# Patient Record
Sex: Male | Born: 1975 | Race: Black or African American | Hispanic: No | Marital: Single | Smoking: Current some day smoker
Health system: Southern US, Community
[De-identification: ages and names within clinical notes are randomized; demographics above are authoritative.]

## PROBLEM LIST (undated history)

## (undated) DIAGNOSIS — F209 Schizophrenia, unspecified: Secondary | ICD-10-CM

## (undated) DIAGNOSIS — F1010 Alcohol abuse, uncomplicated: Secondary | ICD-10-CM

## (undated) DIAGNOSIS — F319 Bipolar disorder, unspecified: Secondary | ICD-10-CM

## (undated) DIAGNOSIS — R569 Unspecified convulsions: Secondary | ICD-10-CM

## (undated) HISTORY — PX: SHOULDER SURGERY: SHX246

## (undated) HISTORY — PX: NECK SURGERY: SHX720

---

## 1999-10-26 ENCOUNTER — Emergency Department (HOSPITAL_COMMUNITY): Admission: EM | Admit: 1999-10-26 | Discharge: 1999-10-26 | Payer: Self-pay

## 2001-03-27 ENCOUNTER — Emergency Department (HOSPITAL_COMMUNITY): Admission: EM | Admit: 2001-03-27 | Discharge: 2001-03-28 | Payer: Self-pay | Admitting: Emergency Medicine

## 2009-07-30 ENCOUNTER — Emergency Department (HOSPITAL_COMMUNITY): Admission: EM | Admit: 2009-07-30 | Discharge: 2009-07-30 | Payer: Self-pay | Admitting: Emergency Medicine

## 2010-07-15 LAB — URINALYSIS, ROUTINE W REFLEX MICROSCOPIC
Hgb urine dipstick: NEGATIVE
Nitrite: NEGATIVE
Specific Gravity, Urine: 1.025 (ref 1.005–1.030)
Urobilinogen, UA: 1 mg/dL (ref 0.0–1.0)
pH: 6.5 (ref 5.0–8.0)

## 2010-07-15 LAB — COMPREHENSIVE METABOLIC PANEL
ALT: 29 U/L (ref 0–53)
AST: 20 U/L (ref 0–37)
Alkaline Phosphatase: 18 U/L — ABNORMAL LOW (ref 39–117)
CO2: 29 mEq/L (ref 19–32)
Chloride: 107 mEq/L (ref 96–112)
GFR calc Af Amer: >60 mL/min (ref >60)
GFR calc non Af Amer: >60 mL/min (ref >60)
Sodium: 141 mEq/L (ref 135–145)
Total Bilirubin: 0.6 mg/dL (ref 0.3–1.2)

## 2010-07-15 LAB — CBC
RBC: 4.51 MIL/uL (ref 4.22–5.81)
WBC: 4.4 10*3/uL (ref 4.0–10.5)

## 2014-09-22 ENCOUNTER — Encounter (HOSPITAL_COMMUNITY): Payer: Self-pay | Admitting: *Deleted

## 2014-09-22 ENCOUNTER — Emergency Department (HOSPITAL_COMMUNITY): Payer: Self-pay

## 2014-09-22 ENCOUNTER — Emergency Department (HOSPITAL_COMMUNITY)
Admission: EM | Admit: 2014-09-22 | Discharge: 2014-09-22 | Disposition: A | Discharge status: Home / Self Care | Payer: Self-pay | Attending: Emergency Medicine | Admitting: Emergency Medicine

## 2014-09-22 DIAGNOSIS — Z79899 Other long term (current) drug therapy: Secondary | ICD-10-CM | POA: Insufficient documentation

## 2014-09-22 DIAGNOSIS — S21111A Laceration without foreign body of right front wall of thorax without penetration into thoracic cavity, initial encounter: Secondary | ICD-10-CM | POA: Insufficient documentation

## 2014-09-22 DIAGNOSIS — Y998 Other external cause status: Secondary | ICD-10-CM | POA: Insufficient documentation

## 2014-09-22 DIAGNOSIS — Y9289 Other specified places as the place of occurrence of the external cause: Secondary | ICD-10-CM | POA: Insufficient documentation

## 2014-09-22 DIAGNOSIS — S199XXA Unspecified injury of neck, initial encounter: Secondary | ICD-10-CM | POA: Insufficient documentation

## 2014-09-22 DIAGNOSIS — F419 Anxiety disorder, unspecified: Secondary | ICD-10-CM | POA: Insufficient documentation

## 2014-09-22 DIAGNOSIS — Z23 Encounter for immunization: Secondary | ICD-10-CM | POA: Insufficient documentation

## 2014-09-22 DIAGNOSIS — F319 Bipolar disorder, unspecified: Secondary | ICD-10-CM | POA: Insufficient documentation

## 2014-09-22 DIAGNOSIS — Y9389 Activity, other specified: Secondary | ICD-10-CM | POA: Insufficient documentation

## 2014-09-22 DIAGNOSIS — IMO0002 Reserved for concepts with insufficient information to code with codable children: Secondary | ICD-10-CM

## 2014-09-22 HISTORY — DX: Schizophrenia, unspecified: F20.9

## 2014-09-22 HISTORY — DX: Bipolar disorder, unspecified: F31.9

## 2014-09-22 MED ORDER — LIDOCAINE-EPINEPHRINE (PF) 2 %-1:200000 IJ SOLN
20.0000 mL | Freq: Once | INTRAMUSCULAR | Status: AC
  Administered 2014-09-22: 20 mL
  Filled 2014-09-22: qty 20

## 2014-09-22 MED ORDER — HYDROCODONE-ACETAMINOPHEN 5-325 MG PO TABS: 1.0000 | ORAL_TABLET | Freq: Four times a day (QID) | ORAL | Status: DC | PRN

## 2014-09-22 MED ORDER — MORPHINE SULFATE 4 MG/ML IJ SOLN
4.0000 mg | Freq: Once | INTRAMUSCULAR | Status: AC
  Administered 2014-09-22: 4 mg via INTRAVENOUS
  Filled 2014-09-22: qty 1

## 2014-09-22 MED ORDER — TETANUS-DIPHTH-ACELL PERTUSSIS 5-2.5-18.5 LF-MCG/0.5 IM SUSP
0.5000 mL | Freq: Once | INTRAMUSCULAR | Status: AC
  Administered 2014-09-22: 0.5 mL via INTRAMUSCULAR
  Filled 2014-09-22: qty 0.5
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
## 2014-09-22 NOTE — ED Notes (Signed)
EKG called to cancel exported EKG.

## 2014-09-22 NOTE — Discharge Instructions (Signed)
Laceration Care, Adult °A laceration is a cut or lesion that goes through all layers of the skin and into the tissue just beneath the skin. °TREATMENT  °Some lacerations may not require closure. Some lacerations may not be able to be closed due to an increased risk of infection. It is important to see your caregiver as soon as possible after an injury to minimize the risk of infection and maximize the opportunity for successful closure. °If closure is appropriate, pain medicines may be given, if needed. The wound will be cleaned to help prevent infection. Your caregiver will use stitches (sutures), staples, wound glue (adhesive), or skin adhesive strips to repair the laceration. These tools bring the skin edges together to allow for faster healing and a better cosmetic outcome. However, all wounds will heal with a scar. Once the wound has healed, scarring can be minimized by covering the wound with sunscreen during the day for 1 full year. °HOME CARE INSTRUCTIONS  °For sutures or staples: °· Keep the wound clean and dry. °· If you were given a bandage (dressing), you should change it at least once a day. Also, change the dressing if it becomes wet or dirty, or as directed by your caregiver. °· Wash the wound with soap and water 2 times a day. Rinse the wound off with water to remove all soap. Pat the wound dry with a clean towel. °· After cleaning, apply a thin layer of the antibiotic ointment as recommended by your caregiver. This will help prevent infection and keep the dressing from sticking. °· You may shower as usual after the first 24 hours. Do not soak the wound in water until the sutures are removed. °· Only take over-the-counter or prescription medicines for pain, discomfort, or fever as directed by your caregiver. °· Get your sutures or staples removed as directed by your caregiver. °For skin adhesive strips: °· Keep the wound clean and dry. °· Do not get the skin adhesive strips wet. You may bathe  carefully, using caution to keep the wound dry. °· If the wound gets wet, pat it dry with a clean towel. °· Skin adhesive strips will fall off on their own. You may trim the strips as the wound heals. Do not remove skin adhesive strips that are still stuck to the wound. They will fall off in time. °For wound adhesive: °· You may briefly wet your wound in the shower or bath. Do not soak or scrub the wound. Do not swim. Avoid periods of heavy perspiration until the skin adhesive has fallen off on its own. After showering or bathing, gently pat the wound dry with a clean towel. °· Do not apply liquid medicine, cream medicine, or ointment medicine to your wound while the skin adhesive is in place. This may loosen the film before your wound is healed. °· If a dressing is placed over the wound, be careful not to apply tape directly over the skin adhesive. This may cause the adhesive to be pulled off before the wound is healed. °· Avoid prolonged exposure to sunlight or tanning lamps while the skin adhesive is in place. Exposure to ultraviolet light in the first year will darken the scar. °· The skin adhesive will usually remain in place for 5 to 10 days, then naturally fall off the skin. Do not pick at the adhesive film. °You may need a tetanus shot if: °· You cannot remember when you had your last tetanus shot. °· You have never had a tetanus   shot. If you get a tetanus shot, your arm may swell, get red, and feel warm to the touch. This is common and not a problem. If you need a tetanus shot and you choose not to have one, there is a rare chance of getting tetanus. Sickness from tetanus can be serious. SEEK MEDICAL CARE IF:   You have redness, swelling, or increasing pain in the wound.  You see a red line that goes away from the wound.  You have yellowish-white fluid (pus) coming from the wound.  You have a fever.  You notice a bad smell coming from the wound or dressing.  Your wound breaks open before or  after sutures have been removed.  You notice something coming out of the wound such as wood or glass.  Your wound is on your hand or foot and you cannot move a finger or toe. SEEK IMMEDIATE MEDICAL CARE IF:   Your pain is not controlled with prescribed medicine.  You have severe swelling around the wound causing pain and numbness or a change in color in your arm, hand, leg, or foot.  Your wound splits open and starts bleeding.  You have worsening numbness, weakness, or loss of function of any joint around or beyond the wound.  You develop painful lumps near the wound or on the skin anywhere on your body. MAKE SURE YOU:   Understand these instructions.  Will watch your condition.  Will get help right away if you are not doing well or get worse. Document Released: 04/12/2005 Document Revised: 07/05/2011 Document Reviewed: 10/06/2010 [facility] [employer] Patient Information 2015 [place], [place]. This information is not intended to replace advice given to you by your health care provider. Make sure you discuss any questions you have with your health care provider.    Please go to your Primary Care Physician, an Urgent Care or return to the Emergency Department to have your staples or sutures removed 14 days from today.

## 2014-09-22 NOTE — ED Notes (Signed)
Resident at bedside performing sutures

## 2014-09-26 ENCOUNTER — Encounter (HOSPITAL_COMMUNITY): Payer: Self-pay | Admitting: Emergency Medicine

## 2014-09-26 ENCOUNTER — Emergency Department (HOSPITAL_COMMUNITY): Payer: Self-pay

## 2014-09-26 ENCOUNTER — Emergency Department (HOSPITAL_COMMUNITY)
Admission: EM | Admit: 2014-09-26 | Discharge: 2014-09-26 | Disposition: A | Discharge status: Home / Self Care | Payer: Self-pay | Attending: Emergency Medicine | Admitting: Emergency Medicine

## 2014-09-26 DIAGNOSIS — S62637A Displaced fracture of distal phalanx of left little finger, initial encounter for closed fracture: Secondary | ICD-10-CM | POA: Insufficient documentation

## 2014-09-26 DIAGNOSIS — Y998 Other external cause status: Secondary | ICD-10-CM | POA: Insufficient documentation

## 2014-09-26 DIAGNOSIS — Z8659 Personal history of other mental and behavioral disorders: Secondary | ICD-10-CM | POA: Insufficient documentation

## 2014-09-26 DIAGNOSIS — S4991XA Unspecified injury of right shoulder and upper arm, initial encounter: Secondary | ICD-10-CM | POA: Insufficient documentation

## 2014-09-26 DIAGNOSIS — Z79899 Other long term (current) drug therapy: Secondary | ICD-10-CM | POA: Insufficient documentation

## 2014-09-26 DIAGNOSIS — Y9389 Activity, other specified: Secondary | ICD-10-CM | POA: Insufficient documentation

## 2014-09-26 DIAGNOSIS — Y92410 Unspecified street and highway as the place of occurrence of the external cause: Secondary | ICD-10-CM | POA: Insufficient documentation

## 2014-09-26 DIAGNOSIS — Z720 Tobacco use: Secondary | ICD-10-CM | POA: Insufficient documentation

## 2014-09-26 DIAGNOSIS — S62609A Fracture of unspecified phalanx of unspecified finger, initial encounter for closed fracture: Secondary | ICD-10-CM

## 2014-09-26 DIAGNOSIS — S4990XA Unspecified injury of shoulder and upper arm, unspecified arm, initial encounter: Secondary | ICD-10-CM

## 2014-09-26 MED ORDER — HYDROCODONE-ACETAMINOPHEN 5-325 MG PO TABS: 1.0000 | ORAL_TABLET | Freq: Four times a day (QID) | ORAL | Status: DC | PRN

## 2014-09-26 MED ORDER — LORAZEPAM 1 MG PO TABS
1.0000 mg | ORAL_TABLET | Freq: Once | ORAL | Status: AC
  Administered 2014-09-26: 1 mg via ORAL
  Filled 2014-09-26: qty 1

## 2014-09-26 MED ORDER — HYDROCODONE-ACETAMINOPHEN 5-325 MG PO TABS
2.0000 | ORAL_TABLET | Freq: Once | ORAL | Status: AC
  Administered 2014-09-26: 2 via ORAL
  Filled 2014-09-26: qty 2
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
## 2014-09-26 NOTE — ED Notes (Signed)
Pt placed into gown and on monitor upon arrival to room. Pt monitored by blood pressure, pulse ox, and 5 lead. pts family at bedside.  
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
## 2014-09-26 NOTE — ED Notes (Signed)
Pt remains monitored by blood pressure, pulse ox, and 5 leade. pts family remains at bedside.

## 2014-09-26 NOTE — Discharge Instructions (Signed)
Please keep your finger splinted at all times. You may remove the splint to bathe.  Finger Fracture Fractures of fingers are breaks in the bones of the fingers. There are many types of fractures. There are different ways of treating these fractures. Your health care provider will discuss the best way to treat your fracture. CAUSES Traumatic injury is the main cause of broken fingers. These include:  Injuries while playing sports.  Workplace injuries.  Falls. RISK FACTORS Activities that can increase your risk of finger fractures include:  Sports.  Workplace activities that involve machinery.  A condition called osteoporosis, which can make your bones less dense and cause them to fracture more easily. SIGNS AND SYMPTOMS The main symptoms of a broken finger are pain and swelling within 15 minutes after the injury. Other symptoms include:  Bruising of your finger.  Stiffness of your finger.  Numbness of your finger.  Exposed bones (compound fracture) if the fracture is severe. DIAGNOSIS  The best way to diagnose a broken bone is with X-ray imaging. Additionally, your health care provider will use this X-ray image to evaluate the position of the broken finger bones.  TREATMENT  Finger fractures can be treated with:   Nonreduction--This means the bones are in place. The finger is splinted without changing the positions of the bone pieces. The splint is usually left on for about a week to 10 days. This will depend on your fracture and what your health care provider thinks.  Closed reduction--The bones are put back into position without using surgery. The finger is then splinted.  Open reduction and internal fixation--The fracture site is opened. Then the bone pieces are fixed into place with pins or some type of hardware. This is seldom required. It depends on the severity of the fracture. HOME CARE INSTRUCTIONS   Follow your health care provider's instructions regarding  activities, exercises, and physical therapy.  Only take over-the-counter or prescription medicines for pain, discomfort, or fever as directed by your health care provider. SEEK MEDICAL CARE IF: You have pain or swelling that limits the motion or use of your fingers. SEEK IMMEDIATE MEDICAL CARE IF:  Your finger becomes numb. MAKE SURE YOU:   Understand these instructions.  Will watch your condition.  Will get help right away if you are not doing well or get worse. Document Released: 07/25/2000 Document Revised: 01/31/2013 Document Reviewed: 11/22/2012 [facility] Patient Information 2015 [place], [place]. This information is not intended to replace advice given to you by your health care provider. Make sure you discuss any questions you have with your health care provider.   Emergency Department Resource Guide 1) Find a Doctor and Pay Out of Pocket Although you won't have to find out who is covered by your insurance plan, it is a good idea to ask around and get recommendations. You will then need to call the office and see if the doctor you have chosen will accept you as a new patient and what types of options they offer for patients who are self-pay. Some doctors offer discounts or will set up payment plans for their patients who do not have insurance, but you will need to ask so you aren't surprised when you get to your appointment.  2) Contact Your Local Health Department Not all health departments have doctors that can see patients for sick visits, but many do, so it is worth a call to see if yours does. If you don't know where your local health department is, you can  check in your phone book. The CDC also has a tool to help you locate your state's health department, and many state websites also have listings of all of their local health departments.  3) Find a Walk-in Clinic If your illness is not likely to be very severe or complicated, you may want to try a walk in clinic. These are  popping up all over the country in pharmacies, drugstores, and shopping centers. They're usually staffed by nurse practitioners or physician assistants that have been trained to treat common illnesses and complaints. They're usually fairly quick and inexpensive. However, if you have serious medical issues or chronic medical problems, these are probably not your best option.  No Primary Care Doctor: - Call Health Connect at  [phone] - they can help you locate a primary care doctor that  accepts your insurance, provides certain services, etc. - Physician Referral Service- [phone]  Chronic Pain Problems: Organization         Address  Phone   Notes  [facility]  [phone] Patients need to be referred by their primary care doctor.   Medication Assistance: Organization         Address  Phone   Notes  [facility] [address] [place], [place] [address] [phone] --Must be a resident of [facility] -- Must have NO insurance coverage whatsoever (no Medicaid/ Medicare, etc.) -- The pt. MUST have a primary care doctor that directs their care regularly and follows them in the community   [facility]  [phone]   [employer]  [phone]    Agencies that provide inexpensive medical care: Organization         Address  Phone   Notes  [facility]  [phone]   [facility]    [phone]   [facility] [address] [place], [place] [address] [phone]   [facility] [address] [place], [place] [phone]   Planned Parenthood    [phone]   [facility]    [phone]   [facility]  [address], [place] Phone:  [phone], Fax:  [phone] Hours of Operation:  9 am - 6 pm, M-F.  Also accepts Medicaid/Medicare and self-pay.  [facility]  [address], [place] Phone: [phone], Fax: [phone]. Hours of Operation:  8:30 am - 5:30 pm, M-F.  Also accepts Medicaid and self-pay.  [facility] [place] [address] [place] Phone: [phone]   [facility] [address] Natasha Bence [place], [place] [phone], Ext. [phone] Mondays & Thursdays: 7-9 AM.  First 15 patients are seen on a first come, first serve basis.    Medicaid-accepting [facility] Providers:  Organization         Address  Phone   Notes  [facility] [address], [place] [phone] Also accepts self-pay patients.  [facility] [address] Laurell Josephs [place], [place]  [phone]   [facility] [address], [place] [phone]   [facility] [address] [place] [phone]   Renaye Rakers [address], [place]   [phone] Only accepts [place] patients after they have their name applied to  their card.   Self-Pay (no insurance) in [facility]:  Organization         Address  Phone   Notes  Sickle Cell Patients, [facility] [address] [place], [place] [phone]   [facility] [address] [place], [place] [phone]   [facility] [place]  [address], [place] [phone]   [facility]/Dr. Osei-Bonsu  [address] [place] or [address], [place] [phone] Phone number for both [place] and [place] locations is the same.  [facility] [address] [place] [phone]   [facility] [address] [place] or [address] [phone]   [facility] [address], [place] [phone], phone; [phone], fax Sees patients 1st and 3rd Saturday  of every month.  Must not qualify for public or private insurance (i.e. Medicaid, Medicare, [place] Health Choice, Veterans' Benefits)  Household income should be no more than 200% of the poverty level The clinic cannot treat you if you are pregnant or think you are pregnant  Sexually transmitted diseases are not treated at the clinic.    Dental Care: Organization         Address  Phone  Notes  [facility] [address] [place], [place] [phone] Accepts children up to age 40 who are enrolled in [place] or [place] Health Choice; pregnant women with a Medicaid card; and children who have applied for Medicaid or [place] Health Choice, but were declined, whose parents can pay a reduced fee at time of service.  [facility] of [facility]  [address], [place] [phone] Accepts children up to age 58 who are enrolled in [place] or [place] Health Choice; pregnant women with a Medicaid card; and children who have applied for Medicaid or [place] Health Choice, but were declined, whose parents can pay a reduced fee at time of service.  [facility]  [address] [place], [place] [phone] Patients are seen by appointment only. Walk-ins are not accepted. [facility] will see patients 37 years of age and older. Monday - Tuesday (8am-5pm) Most Wednesdays (8:30-5pm) $30 per visit, cash only  [facility]  [address], [facility] [phone] Patients are seen by appointment only. Walk-ins are not accepted. [facility] will see patients 54 years of age and older. One Wednesday Evening (Monthly: Volunteer Based).  $30 per visit, cash only  [employer]  [phone] for adults; Children under age 5, call Graduate Pediatric Dentistry at [phone]. Children aged 86-14, please call [phone] to request a pediatric application.   Dental services are provided in all areas of dental care including fillings, crowns and bridges, complete and partial dentures, implants, gum treatment, root canals, and extractions. Preventive care is also provided. Treatment is provided to both adults and children. Patients are selected via a lottery and there is often a waiting list.   [facility] [address] [place]  [phone] [email or web address]   [facility] [address] [place], [place] [phone], Ext. [phone] Second and Fourth Thursday of each month, opens at 6:30 AM; Clinic ends at 9 AM.  Patients are seen on a first-come first-served basis,  and a limited number are seen during each clinic.   [facility]  [address], [place], [place] [phone]   Eligibility Requirements You must have lived in [place], [place], or [place] counties for at least the last three months.   You cannot be eligible for state or federal sponsored [place]healthcare insurance, including CIG[employer]eterans Administration, [place]icaid, or [employer]icare.   You generally cannot be eligible for healthcare insurance through your employer.    How to apply: Eligibility screenings are held every Tuesday and Wednesday afternoon from 1:00 pm until 4:00 pm. You do not need an appointment for the interview!  [facility] [address], [place], [place] [phone]   [facility]  [phone]   [facility]  [phone]   [facility]  [phone]    Behavioral Health Resources in the Community: Intensive Outpatient Programs Organization         Address  Phone  Notes  [facility] [address], [place], [place] [phone]   [facility] [address], [place], [place] [phone]   [facility] [address], [place], [place]  [phone]   [facility] [address],  [place], [place] [phone] or [phone]   Substance Abuse Resources Organization         Address  Phone  Notes  [facility]  [phone]   [facility]  [phone]   [facility]  [phone]   [facility]  [phone]   Residential & Outpatient Substance Abuse Program  [phone]   Psychological Services Organization         Address  Phone  Notes  [facility]  [phone]   [facility]  [phone]   [facility] [address], [place] [phone] or [phone]    Mobile Crisis Teams Organization         Address  Phone  Notes  [facility], Mobile Crisis Care Unit  [phone]   [facility]  [address] [place], [place] [phone]   Doristine LocksSharon DeEsch [address] [place] [phone]    Self-Help/Support Groups Organization         Address  Phone             Notes  [facility] [place] - variety of support groups  [phone] Call for more information  Narcotics Anonymous (NA), [facility]aring [facility] [address], [place]  2 meetings at this location   Statisticianesidential Treatment Programs Organization         Address  Phone  Notes  [facility] [address],    [place]  [phone]   [facility]  [address], [place] [phone], [place], [place] [phone]   [facility] [address], [place] [phone] Admissions: 8am-3pm M-F  [facility] [address]    [place], [place] [phone]   [facility] [address], [place], [place] [phone]   [facility] [address]  [place], [place] [phone]   [facility] - Intensive Outpatient [address], [place], [place] [phone]   [facility] ([facility] [address]  [place], [place]  [phone] or [phone]   [facility] ([facility]) [address] [place], [place] [phone] Accepts Medicaid  [facility] [address],  [place] [phone] Substance Abuse/Addiction Treatment   [facility] Organization         Address  Phone  Notes  [facility]  [phone]   Domenic Schwab, PhD [address] [place], [place]   [phone] or [phone]   Alfred [place], [place] [phone]   [address], [place], [place] [phone] Insurance/Medicaid/sponsorship through [facility] and Families [address]                                    [place], [place] [phone] [place] [address], [place] [phone]    Dr. Adele Schilder  [phone]   [facility] Dept. 1) [address] [place] 2) [place] 3)  [address], [place] [phone]  [phone]   [place] [phone] or [phone] (After Hours)

## 2014-09-26 NOTE — ED Notes (Signed)
Patient transported to X-ray 

## 2014-09-26 NOTE — ED Notes (Signed)
Pt stated that taking pain medication w/o food.  Provider approved for pt to have some crackers and apple juice.  Given to pt.  Will return w/ medication after food is consumed.

## 2014-10-08 ENCOUNTER — Emergency Department (HOSPITAL_COMMUNITY): Payer: Self-pay

## 2014-10-08 ENCOUNTER — Encounter (HOSPITAL_COMMUNITY): Payer: Self-pay | Admitting: Emergency Medicine

## 2014-10-08 ENCOUNTER — Emergency Department (HOSPITAL_COMMUNITY)
Admission: EM | Admit: 2014-10-08 | Discharge: 2014-10-10 | Disposition: A | Discharge status: Home / Self Care | Payer: Self-pay | Attending: Emergency Medicine | Admitting: Emergency Medicine

## 2014-10-08 DIAGNOSIS — R45851 Suicidal ideations: Secondary | ICD-10-CM

## 2014-10-08 DIAGNOSIS — F319 Bipolar disorder, unspecified: Secondary | ICD-10-CM | POA: Insufficient documentation

## 2014-10-08 DIAGNOSIS — X58XXXA Exposure to other specified factors, initial encounter: Secondary | ICD-10-CM | POA: Insufficient documentation

## 2014-10-08 DIAGNOSIS — Y9289 Other specified places as the place of occurrence of the external cause: Secondary | ICD-10-CM | POA: Insufficient documentation

## 2014-10-08 DIAGNOSIS — Z720 Tobacco use: Secondary | ICD-10-CM | POA: Insufficient documentation

## 2014-10-08 DIAGNOSIS — Y9389 Activity, other specified: Secondary | ICD-10-CM | POA: Insufficient documentation

## 2014-10-08 DIAGNOSIS — F1410 Cocaine abuse, uncomplicated: Secondary | ICD-10-CM | POA: Insufficient documentation

## 2014-10-08 DIAGNOSIS — G40909 Epilepsy, unspecified, not intractable, without status epilepticus: Secondary | ICD-10-CM | POA: Insufficient documentation

## 2014-10-08 DIAGNOSIS — Z79899 Other long term (current) drug therapy: Secondary | ICD-10-CM | POA: Insufficient documentation

## 2014-10-08 DIAGNOSIS — T189XXA Foreign body of alimentary tract, part unspecified, initial encounter: Secondary | ICD-10-CM | POA: Insufficient documentation

## 2014-10-08 DIAGNOSIS — F209 Schizophrenia, unspecified: Secondary | ICD-10-CM | POA: Diagnosis present

## 2014-10-08 DIAGNOSIS — Y998 Other external cause status: Secondary | ICD-10-CM | POA: Insufficient documentation

## 2014-10-08 HISTORY — DX: Unspecified convulsions: R56.9

## 2014-10-08 LAB — BASIC METABOLIC PANEL
Anion gap: 7 (ref 5–15)
BUN: 6 mg/dL (ref 6–20)
CO2: 30 mmol/L (ref 22–32)
Calcium: 9.5 mg/dL (ref 8.9–10.3)
Chloride: 106 mmol/L (ref 101–111)
Creatinine, Ser: 1.03 mg/dL (ref 0.61–1.24)
GFR calc Af Amer: >60 mL/min (ref >60)
GFR calc non Af Amer: >60 mL/min (ref >60)
Glucose, Bld: 77 mg/dL (ref 65–99)
Potassium: 4 mmol/L (ref 3.5–5.1)
Sodium: 143 mmol/L (ref 135–145)

## 2014-10-08 LAB — RAPID URINE DRUG SCREEN, HOSP PERFORMED
Amphetamines: NOT DETECTED
Barbiturates: NOT DETECTED
Benzodiazepines: NOT DETECTED
Cocaine: POSITIVE — AB
Opiates: NOT DETECTED
Tetrahydrocannabinol: NOT DETECTED

## 2014-10-08 LAB — CBC WITH DIFFERENTIAL/PLATELET
Basophils Absolute: 0 10*3/uL (ref 0.0–0.1)
Basophils Relative: 1 % (ref 0–1)
Eosinophils Absolute: 0.2 10*3/uL (ref 0.0–0.7)
Eosinophils Relative: 4 % (ref 0–5)
HCT: 42.4 % (ref 39.0–52.0)
Hemoglobin: 13.9 g/dL (ref 13.0–17.0)
Lymphocytes Relative: 48 % — ABNORMAL HIGH (ref 12–46)
Lymphs Abs: 2.8 10*3/uL (ref 0.7–4.0)
MCH: 27.9 pg (ref 26.0–34.0)
MCHC: 32.8 g/dL (ref 30.0–36.0)
MCV: 85 fL (ref 78.0–100.0)
Monocytes Absolute: 0.4 10*3/uL (ref 0.1–1.0)
Monocytes Relative: 8 % (ref 3–12)
Neutro Abs: 2.2 10*3/uL (ref 1.7–7.7)
Neutrophils Relative %: 39 % — ABNORMAL LOW (ref 43–77)
Platelets: 345 10*3/uL (ref 150–400)
RBC: 4.99 MIL/uL (ref 4.22–5.81)
RDW: 14.9 % (ref 11.5–15.5)
WBC: 5.7 10*3/uL (ref 4.0–10.5)

## 2014-10-08 LAB — SALICYLATE LEVEL: Salicylate Lvl: <4 mg/dL (ref 2.8–30.0)

## 2014-10-08 LAB — ETHANOL: Alcohol, Ethyl (B): 107 mg/dL — ABNORMAL HIGH (ref <5)

## 2014-10-08 LAB — ACETAMINOPHEN LEVEL: Acetaminophen (Tylenol), Serum: <10 ug/mL — ABNORMAL LOW (ref 10–30)

## 2014-10-08 MED ORDER — IBUPROFEN 200 MG PO TABS
600.0000 mg | ORAL_TABLET | Freq: Three times a day (TID) | ORAL | Status: DC | PRN
  Administered 2014-10-08 – 2014-10-09 (×2): 600 mg via ORAL
  Filled 2014-10-08 (×2): qty 3

## 2014-10-08 MED ORDER — ZOLPIDEM TARTRATE 5 MG PO TABS: 5.0000 mg | ORAL_TABLET | Freq: Every evening | ORAL | Status: DC | PRN

## 2014-10-08 MED ORDER — STERILE WATER FOR INJECTION IJ SOLN
INTRAMUSCULAR | Status: AC
  Administered 2014-10-08: 19:00:00
  Filled 2014-10-08: qty 10

## 2014-10-08 MED ORDER — ALUM & MAG HYDROXIDE-SIMETH 200-200-20 MG/5ML PO SUSP: 30.0000 mL | ORAL | Status: DC | PRN

## 2014-10-08 MED ORDER — ACETAMINOPHEN 325 MG PO TABS: 650.0000 mg | ORAL_TABLET | ORAL | Status: DC | PRN

## 2014-10-08 MED ORDER — ZIPRASIDONE MESYLATE 20 MG IM SOLR
INTRAMUSCULAR | Status: AC
  Administered 2014-10-08: 20 mg via INTRAMUSCULAR
  Filled 2014-10-08: qty 20

## 2014-10-08 MED ORDER — LORAZEPAM 1 MG PO TABS
1.0000 mg | ORAL_TABLET | Freq: Three times a day (TID) | ORAL | Status: DC | PRN
  Administered 2014-10-09: 1 mg via ORAL
  Filled 2014-10-08: qty 1

## 2014-10-08 MED ORDER — ONDANSETRON HCL 4 MG PO TABS: 4.0000 mg | ORAL_TABLET | Freq: Three times a day (TID) | ORAL | Status: DC | PRN

## 2014-10-08 MED ORDER — ZIPRASIDONE MESYLATE 20 MG IM SOLR
20.0000 mg | Freq: Once | INTRAMUSCULAR | Status: AC
  Administered 2014-10-08: 20 mg via INTRAMUSCULAR

## 2014-10-08 NOTE — ED Provider Notes (Signed)
CSN: [id number]     Arrival date & time 10/08/14  1654 History   First MD Initiated Contact with Patient 10/08/14 1732     Chief Complaint  Patient presents with  . Hallucinations  . Swallowed Foreign Body  . Multiple Complaints      (Consider location/radiation/quality/duration/timing/severity/associated sxs/prior Treatment) Patient is a 39 y.o. male presenting with foreign body swallowed.  Swallowed Foreign Body   Patient was brought into the emergency department for increasing agitation, psychosis following that, having his medicines over the last 24 hours.  Patient does not give me much history.  He states that his sister picked up his medications and he is not able to take them.  Patient, no other complaints or issues to me.  He states he has been hallucinating and he swallowed several pennies Past Medical History  Diagnosis Date  . Bipolar 1 disorder   . Schizophrenia   . Seizure    History reviewed. No pertinent past surgical history. History reviewed. No pertinent family history. History  Substance Use Topics  . Smoking status: Current Every Day Smoker    Types: Cigarettes  . Smokeless tobacco: Never Used  . Alcohol Use: Yes    Review of Systems Level V caveat applies due to psychosis   Allergies  Review of patient's allergies indicates no known allergies.  Home Medications   Prior to Admission medications   Medication Sig Start Date End Date Taking? Authorizing Provider  ChlorproMAZINE HCl (THORAZINE PO) Take 1 tablet by mouth daily.   Yes Historical Provider, MD  HYDROcodone-acetaminophen (NORCO/VICODIN) 5-325 MG per tablet Take 1 tablet by mouth every 6 (six) hours as needed for moderate pain. 09/26/14  Yes Elwin Mocha, MD  HYDROcodone-acetaminophen (NORCO/VICODIN) 5-325 MG per tablet Take 1 tablet by mouth every 6 (six) hours as needed. Patient not taking: Reported on 10/08/2014 09/22/14   Theda Belfast, MD  PRESCRIPTION MEDICATION Take 1 tablet by mouth 2  (two) times daily. Seizure med #1    Historical Provider, MD  PRESCRIPTION MEDICATION Take 1 tablet by mouth 2 (two) times daily. Seizure med #2    Historical Provider, MD  PRESCRIPTION MEDICATION Take 1 tablet by mouth daily. adhd medication    Historical Provider, MD  PRESCRIPTION MEDICATION Take 1 tablet by mouth daily. Haldol    Historical Provider, MD   BP 101/46 mmHg  Pulse 95  Temp(Src) 98.6 F (37 C) (Oral)  Resp 12  SpO2 100% Physical Exam  Constitutional: He appears well-developed and well-nourished. No distress.  HENT:  Head: Normocephalic and atraumatic.  Eyes: Pupils are equal, round, and reactive to light.  Neck: Normal range of motion. Neck supple.  Cardiovascular: Normal rate, regular rhythm and normal heart sounds.  Exam reveals no gallop and no friction rub.   No murmur heard. Pulmonary/Chest: Effort normal and breath sounds normal.  Musculoskeletal: He exhibits no edema.  Neurological: He is alert. He exhibits normal muscle tone. Coordination normal.  Skin: Skin is warm and dry. No rash noted. No erythema.  Psychiatric: He has a normal mood and affect. His speech is rapid and/or pressured. He is agitated, aggressive and combative. Thought content is delusional. He expresses suicidal ideation. He expresses no homicidal ideation. He expresses no suicidal plans and no homicidal plans.  Nursing note and vitals reviewed.   ED Course  Procedures (including critical care time) Labs Review Labs Reviewed  URINE RAPID DRUG SCREEN, HOSP PERFORMED - Abnormal; Notable for the following:    Cocaine POSITIVE (*)  All other components within normal limits  ETHANOL - Abnormal; Notable for the following:    Alcohol, Ethyl (B) 107 (*)    All other components within normal limits  CBC WITH DIFFERENTIAL/PLATELET - Abnormal; Notable for the following:    Neutrophils Relative % 39 (*)    Lymphocytes Relative 48 (*)    All other components within normal limits  ACETAMINOPHEN  LEVEL - Abnormal; Notable for the following:    Acetaminophen (Tylenol), Serum <10 (*)    All other components within normal limits  BASIC METABOLIC PANEL  SALICYLATE LEVEL    Imaging Review Dg Abd Acute W/chest  10/08/2014   CLINICAL DATA:  Suicidal ideation.  Ingested foreign bodies.  EXAM: DG ABDOMEN ACUTE W/ 1V CHEST  COMPARISON:  09/22/2014 chest radiograph.  FINDINGS: There is no evidence of dilated bowel loops or free intraperitoneal air. No radiopaque calculi or other significant radiographic abnormality is seen. Heart size and mediastinal contours are within normal limits. Both lungs are clear.  Specifically, no radiopaque foreign bodies  are present.  IMPRESSION: Negative abdominal radiographs.  No acute cardiopulmonary disease.   Electronically Signed   By: Andreas Newport M.D.   On: 10/08/2014 19:43   Dg Hand Complete Left  10/08/2014   CLINICAL DATA:  Punched a wall, says he broke his finger, hallucinations, agitation  EXAM: LEFT HAND - COMPLETE 3+ VIEW  COMPARISON:  LEFT little finger radiographs 09/26/2014  FINDINGS: Osseous mineralization normal.  Joint spaces preserved.  Linear lucency identified obliquely at the distal aspect of the proximal phalanx ring finger.  This finding is also present at the margin of the oblique view of the previous exam indicating this does not represent an acute fracture.  No acute fracture, dislocation, or bone destruction.  IMPRESSION: No acute osseous abnormalities.   Electronically Signed   By: Ulyses Southward M.D.   On: 10/08/2014 20:21   Dg Hand Complete Right  10/08/2014   CLINICAL DATA:  Patient states he punched a wall and broke his finger. Initial encounter.  EXAM: RIGHT HAND - COMPLETE 3+ VIEW  COMPARISON:  None.  FINDINGS: There is no evidence of acute fracture or dislocation.  Broad appearance of the first metacarpal is likely from remote and healed fracture. Corticated ossicle posterior to the carpus may reflect a remote triquetrum fracture.   IMPRESSION: No acute osseous findings.   Electronically Signed   By: Marnee Spring M.D.   On: 10/08/2014 19:43     EKG Interpretation   Date/Time:  Tuesday October 08 2014 19:44:25 EDT Ventricular Rate:  87 PR Interval:  164 QRS Duration: 84 QT Interval:  366 QTC Calculation: 440 R Axis:   72 Text Interpretation:  Sinus rhythm Biatrial enlargement ST elev, probable  normal early repol pattern No significant change since last tracing  Confirmed by Ethelda Chick  MD, SAM [phone]) on 10/08/2014 7:50:56 PM      MDM   Final diagnoses:  Swallowed foreign body  Swallowed foreign body    Patient will need TTS assessment as he does appear psychotic and talking about harming himself   Charlestine Night, PA-C 10/08/14 2349  Doug Sou, MD 10/09/14 3532

## 2014-10-08 NOTE — ED Notes (Addendum)
Pt became increasingly escalated after being told he would be IVC'd by ED MD stating he would not stay overnight in the hospital. Attempted to explain to pt that he stated earlier that he was suicidal and this was why he was being kept in the ED. Pt began cursing loudly, stating he was intoxicated earlier and this is why he was saying those things, note that the suicidal statements were made and documented at 1800 today, 40 minutes prior to this episode, and pt showed no signs of intoxication the entire time he has been in dept. He then started opening drawers stating if he was suicidal he would find something to hurt himself with. Any de-escalation attempts were unsuccessful and ultimately security called to bedside for safety. Orders received for restraints and Geodon. Med administered. Security remains at bedside. Changed into maroon scrubs and personal belongings removed from room. 2 searched personal belongings bags placed behind nurses station in front of rm 17.

## 2014-10-08 NOTE — ED Notes (Signed)
Xray by to take pt for imaging. Instructed that we can call them back once pt is calmer.

## 2014-10-08 NOTE — ED Notes (Signed)
Pt ambulated around nursing station with steady gait and no complications.

## 2014-10-08 NOTE — ED Notes (Addendum)
Pt sts he came in today because he "broke his finger" when he punched a wall. Then says swallowed 2 pennies because he is suicidal. Denies ingesting other substances, or any other self-harm. Sts he has not had his meds since yesterday because his sister packed them up in a moving truck. Then begins talking about having 20 police officers called out on him recently. Sts he has intermittent visual and auditory hallucinations and attributes this to not having his meds since yesterday. Pt being extremely inappropriate towards one nurse making sexual comments and gestures. Pt instructed multiple not to continue this.

## 2014-10-08 NOTE — ED Notes (Signed)
Bed: QM25 Expected date:  Expected time:  Means of arrival:  Comments: Hold for Triage 4
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
## 2014-10-08 NOTE — ED Notes (Addendum)
Pt arrives from [facility] and states that he did not take his medications last night and is thus hallucinating as he is schizophrenic and bipolar. Pt had R shoulder surgery 2 weeks ago and his stitches need to be removed from that. Also states that he punched a wall with his R hand which is causing him pain and he states that he swallowed coin(s). ETOH. Alert.

## 2014-10-09 DIAGNOSIS — F209 Schizophrenia, unspecified: Secondary | ICD-10-CM | POA: Diagnosis present

## 2014-10-09 DIAGNOSIS — R45851 Suicidal ideations: Secondary | ICD-10-CM

## 2014-10-09 MED ORDER — ZIPRASIDONE MESYLATE 20 MG IM SOLR
INTRAMUSCULAR | Status: AC
  Filled 2014-10-09: qty 20

## 2014-10-09 MED ORDER — RISPERIDONE 2 MG PO TABS
2.0000 mg | ORAL_TABLET | Freq: Two times a day (BID) | ORAL | Status: DC
  Administered 2014-10-09 – 2014-10-10 (×3): 2 mg via ORAL
  Filled 2014-10-09 (×3): qty 1

## 2014-10-09 MED ORDER — DIPHENHYDRAMINE HCL 50 MG/ML IJ SOLN
INTRAMUSCULAR | Status: AC
  Administered 2014-10-09: 10:00:00
  Filled 2014-10-09: qty 1

## 2014-10-09 MED ORDER — ZIPRASIDONE MESYLATE 20 MG IM SOLR
20.0000 mg | Freq: Once | INTRAMUSCULAR | Status: AC
  Administered 2014-10-09: 20 mg via INTRAMUSCULAR

## 2014-10-09 MED ORDER — LORAZEPAM 2 MG/ML IJ SOLN
INTRAMUSCULAR | Status: AC
  Filled 2014-10-09: qty 1

## 2014-10-09 MED ORDER — LORAZEPAM 2 MG/ML PO CONC
ORAL | Status: AC
  Filled 2014-10-09: qty 1

## 2014-10-09 MED ORDER — LORAZEPAM 2 MG/ML IJ SOLN
2.0000 mg | Freq: Four times a day (QID) | INTRAMUSCULAR | Status: DC
  Administered 2014-10-09: 2 mg via INTRAMUSCULAR
  Filled 2014-10-09: qty 1

## 2014-10-09 MED ORDER — AMANTADINE HCL 100 MG PO CAPS
100.0000 mg | ORAL_CAPSULE | Freq: Two times a day (BID) | ORAL | Status: DC
  Administered 2014-10-09 – 2014-10-10 (×3): 100 mg via ORAL
  Filled 2014-10-09 (×5): qty 1

## 2014-10-09 MED ORDER — TRAZODONE HCL 100 MG PO TABS
100.0000 mg | ORAL_TABLET | Freq: Every day | ORAL | Status: DC
  Administered 2014-10-09: 100 mg via ORAL
  Filled 2014-10-09: qty 1

## 2014-10-09 MED ORDER — DIPHENHYDRAMINE HCL 50 MG/ML IJ SOLN: 50.0000 mg | Freq: Once | INTRAMUSCULAR | Status: DC
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
## 2014-10-09 NOTE — Progress Notes (Signed)
CM left pt resources for  self pay pcps, discussed the importance of pcp vs EDP services for f/u care, www.needymeds.org, www.goodrx.com, discounted pharmacies and other [employer] such as [employer] , [employer], affordable care act,  [place] med assist, financial assistance, self pay dental services,  med assist, DSS and  health department  Reviewed resources for [employer] self pay pcps like [facility], family medicine at [employer], [facility], [facility], local urgent care centers, [facility], [facility] family practice, general medical clinics, family services of the [place], [facility] urgent care plus others, medication resources, [facility] out patient pharmacies and housing Pt voiced understanding and appreciation of resources provided

## 2014-10-09 NOTE — ED Notes (Signed)
Pt evaluated by TTS Berna Spare.

## 2014-10-09 NOTE — ED Notes (Addendum)
Pt transferred from main ed, accompanied by [employer], presents with complaint of SI, no specific plan, agitated initially in ED. Pt IVCed by ED physician. Pt referred to ED from [facility].  Pt denies SI at present, denies  HI, admits to hearing voices of babies crying and seeing bats.  Pt reports he sometimes feels hopeless.  Complaint of rt hand pain, Diagnosed with Schizophrenia, Bipolar and ADHD in the past.  Pt calm and cooperative at present, no acute distress noted, Monitoring for safety, Q 15 min checks in effect.
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
## 2014-10-09 NOTE — ED Notes (Signed)
Patient was sleeping, vitals will be taken at a later time. Attending RN notified.

## 2014-10-09 NOTE — Consult Note (Signed)
[facility] Face-to-Face Psychiatry Consult   Reason for Consult:  Psychosis, hallucinations Referring Physician:  EDP Patient Identification: Garrett Arellano MRN:  [id number] Principal Diagnosis: Schizophrenia, unspecified type Diagnosis:   Patient Active Problem List   Diagnosis Date Noted  . Suicidal ideation [R45.851] 10/09/2014    Priority: High  . Schizophrenia, unspecified type [F20.9]     Priority: High    Total Time spent with patient: 30 minutes  Subjective:   Garrett Arellano is a 39 y.o. male patient admitted with reports of aggression and self-injurious behavior having punched a wall and hurt his finger. He reported that he wanted to kill himself, but later denied this. Pt is clearly psychotic. Pt's UDS was positive for cocaine and ETOH. Pt continues to meet inpatient criteria due to risk to himself and others secondary to impaired reality-testing.   HPI:  I have reviewed and concur with HPI/Collateral with the following changes:  Garrett Arellano is an 39 y.o. male. The TTS Clinician spoke with Irena Cords, PA regarding need for TTS. Patient initially came to [facility] from [facility] to have a finger examined on his right hand after he had hit a wall there. Patient told staff at [facility] that he wanted to kill himself. Patient later recanted this, saying that he made the statements when inebriated. This is not true according to documentation, patient was sober when he said he wanted to kill himself by swallowing pennies.  Patient became upset and assaultive towards hospital staff, yelling and lunging at them. Patient was IVC'ed by Dr. Milagros Evener. Patient restrained and administered geodon. Patient denies SI, HI to [facility] nursing staff. Admits to A/V hallucinations of seeing bats and hearing babies cry. Patient admits to being depressed and feeling hopeless.  This clinician talked to patient very briefly. Patient says he needs his medications for his ADHD. Patient was very sleepy and unable to  stay awake for assessment.  -Clinician discussed patient care with Patriciaann Clan, PA who recommended patient be seen by psychiatry in AM to uphold or rescind IVC. This disposition discussed with Irena Cords, Car[place].  Past Medical History:  Past Medical History  Diagnosis Date  . Bipolar 1 disorder   . Schizophrenia   . Seizure    History reviewed. No pertinent past surgical history. Family History: History reviewed. No pertinent family history. Social History:  History  Alcohol Use  . Yes     History  Drug Use No    History   Social History  . Marital Status: Married    Spouse Name: N/A  . Number of Children: N/A  . Years of Education: N/A   Social History Main Topics  . Smoking status: Current Every Day Smoker    Types: Cigarettes  . Smokeless tobacco: Never Used  . Alcohol Use: Yes  . Drug Use: No  . Sexual Activity: Not on file   Other Topics Concern  . None   Social History Narrative   Additional Social History:    Pain Medications: See PTA midications Prescriptions: See PTA medication list Over the Counter: See PTA medication list History of alcohol / drug use?: Yes Name of Substance 1: ETOH 1 - Age of First Use: Teens 1 - Amount (size/oz): One or two 40 oz at a time 1 - Frequency: 3-4 times in a week 1 - Duration: On-going 1 - Last Use / Amount: 06/14  Name of Substance 2: Cocaine 2 - Age of First Use: Unknown  2 - Amount (size/oz): Varies 2 -  Frequency: According to money 2 - Duration: On-going  2 - Last Use / Amount: Can't remember                 Allergies:  No Known Allergies  Labs:  Results for orders placed or performed during the hospital encounter of 10/08/14 (from the past 48 hour(s))  Urine rapid drug screen (hosp performed)     Status: Abnormal   Collection Time: 10/08/14  6:06 PM  Result Value Ref Range   Opiates NONE DETECTED NONE DETECTED   Cocaine POSITIVE (A) NONE DETECTED   Benzodiazepines NONE DETECTED NONE DETECTED    Amphetamines NONE DETECTED NONE DETECTED   Tetrahydrocannabinol NONE DETECTED NONE DETECTED   Barbiturates NONE DETECTED NONE DETECTED    Comment:        DRUG SCREEN FOR MEDICAL PURPOSES ONLY.  IF CONFIRMATION IS NEEDED FOR ANY PURPOSE, NOTIFY LAB WITHIN 5 DAYS.        LOWEST DETECTABLE LIMITS FOR URINE DRUG SCREEN Drug Class       Cutoff (ng/mL) Amphetamine      1000 Barbiturate      200 Benzodiazepine   448 Tricyclics       185 Opiates          300 Cocaine          300 THC              50   Ethanol     Status: Abnormal   Collection Time: 10/08/14  6:14 PM  Result Value Ref Range   Alcohol, Ethyl (B) 107 (H) <5 mg/dL    Comment:        LOWEST DETECTABLE LIMIT FOR SERUM ALCOHOL IS 5 mg/dL FOR MEDICAL PURPOSES ONLY   CBC with Differential     Status: Abnormal   Collection Time: 10/08/14  6:14 PM  Result Value Ref Range   WBC 5.7 4.0 - 10.5 K/uL   RBC 4.99 4.22 - 5.81 MIL/uL   Hemoglobin 13.9 13.0 - 17.0 g/dL   HCT 42.4 39.0 - 52.0 %   MCV 85.0 78.0 - 100.0 fL   MCH 27.9 26.0 - 34.0 pg   MCHC 32.8 30.0 - 36.0 g/dL   RDW 14.9 11.5 - 15.5 %   Platelets 345 150 - 400 K/uL   Neutrophils Relative % 39 (L) 43 - 77 %   Neutro Abs 2.2 1.7 - 7.7 K/uL   Lymphocytes Relative 48 (H) 12 - 46 %   Lymphs Abs 2.8 0.7 - 4.0 K/uL   Monocytes Relative 8 3 - 12 %   Monocytes Absolute 0.4 0.1 - 1.0 K/uL   Eosinophils Relative 4 0 - 5 %   Eosinophils Absolute 0.2 0.0 - 0.7 K/uL   Basophils Relative 1 0 - 1 %   Basophils Absolute 0.0 0.0 - 0.1 K/uL  Basic metabolic panel     Status: None   Collection Time: 10/08/14  6:14 PM  Result Value Ref Range   Sodium 143 135 - 145 mmol/L   Potassium 4.0 3.5 - 5.1 mmol/L   Chloride 106 101 - 111 mmol/L   CO2 30 22 - 32 mmol/L   Glucose, Bld 77 65 - 99 mg/dL   BUN 6 6 - 20 mg/dL   Creatinine, Ser 1.03 0.61 - 1.24 mg/dL   Calcium 9.5 8.9 - 10.3 mg/dL   GFR calc non Af Amer >60 >60 mL/min   GFR calc Af Amer >60 >60 mL/min    Comment:  (  NOTE) The eGFR has been calculated using the CKD EPI equation. This calculation has not been validated in all clinical situations. eGFR's persistently <60 mL/min signify possible Chronic Kidney Disease.    Anion gap 7 5 - 15  Acetaminophen level     Status: Abnormal   Collection Time: 10/08/14  6:14 PM  Result Value Ref Range   Acetaminophen (Tylenol), Serum <10 (L) 10 - 30 ug/mL    Comment:        THERAPEUTIC CONCENTRATIONS VARY SIGNIFICANTLY. A RANGE OF 10-30 ug/mL MAY BE AN EFFECTIVE CONCENTRATION FOR MANY PATIENTS. HOWEVER, SOME ARE BEST TREATED AT CONCENTRATIONS OUTSIDE THIS RANGE. ACETAMINOPHEN CONCENTRATIONS >150 ug/mL AT 4 HOURS AFTER INGESTION AND >50 ug/mL AT 12 HOURS AFTER INGESTION ARE OFTEN ASSOCIATED WITH TOXIC REACTIONS.   Salicylate level     Status: None   Collection Time: 10/08/14  6:14 PM  Result Value Ref Range   Salicylate Lvl <2.9 2.8 - 30.0 mg/dL    Vitals: Blood pressure 123/73, pulse 68, temperature 98 F (36.7 C), temperature source Oral, resp. rate 20, SpO2 100 %.  Risk to Self: Suicidal Ideation: No-Not Currently/Within Last 6 Months Suicidal Intent: No Is patient at risk for suicide?: No Suicidal Plan?: No (Pt had earlier said he swallowed pennies to kill self.) Access to Means: Yes Specify Access to Suicidal Means: Swallowing coinage. What has been your use of drugs/alcohol within the last 12 months?: UDS + for cocaine; BAL positive How many times?:  (Unknown) Other Self Harm Risks: None Triggers for Past Attempts: Hallucinations Intentional Self Injurious Behavior: None Risk to Others: Homicidal Ideation: No-Not Currently/Within Last 6 Months Thoughts of Harm to Others: No-Not Currently Present/Within Last 6 Months Current Homicidal Intent: No Current Homicidal Plan: No Access to Homicidal Means: No Identified Victim: Threatened to kill doctor and staff History of harm to others?: Yes Assessment of Violence: On admission Violent  Behavior Description: Trying to hit hospital staff Does patient have access to weapons?: No (Unknown) Criminal Charges Pending?:  (Unknown) Does patient have a court date:  (Unknown) Prior Inpatient Therapy: Prior Inpatient Therapy: Yes Prior Therapy Dates: Unknown Prior Therapy Facilty/Provider(s): Unknown Reason for Treatment: Unknown Prior Outpatient Therapy: Prior Outpatient Therapy: Yes Prior Therapy Dates: Current Prior Therapy Facilty/Provider(s): [facility] Reason for Treatment: med management Does patient have an ACCT team?: No Does patient have Intensive In-House Services?  : No Does patient have [facility] services? : No Does patient have P4CC services?: No  Current Facility-Administered Medications  Medication Dose Route Frequency Provider Last Rate Last Dose  . acetaminophen (TYLENOL) tablet 650 mg  650 mg Oral Q4H PRN Dalia Heading, PA-C      . alum & mag hydroxide-simeth (MAALOX/MYLANTA) 200-200-20 MG/5ML suspension 30 mL  30 mL Oral PRN Dalia Heading, PA-C      . amantadine (SYMMETREL) capsule 100 mg  100 mg Oral BID Tyrisha Benninger      . diphenhydrAMINE (BENADRYL) injection 50 mg  50 mg Intravenous Once Arena Lindahl   50 mg at 10/09/14 1023  . ibuprofen (ADVIL,MOTRIN) tablet 600 mg  600 mg Oral Q8H PRN Dalia Heading, PA-C   600 mg at 10/08/14 2349  . LORazepam (ATIVAN) 2 MG/ML concentrated solution           . LORazepam (ATIVAN) 2 MG/ML injection           . LORazepam (ATIVAN) injection 2 mg  2 mg Intramuscular Q6H Precious Segall   2 mg at 10/09/14 1024  . LORazepam (ATIVAN)  tablet 1 mg  1 mg Oral Q8H PRN Dalia Heading, PA-C      . ondanset[facility]) tablet 4 mg  4 mg Oral Q8H PRN Dalia Heading, PA-C      . risperiDONE (RISPERDAL) tablet 2 mg  2 mg Oral BID Jaiden Dinkins      . traZODone (DESYREL) tablet 100 mg  100 mg Oral QHS Jaykub Mackins       Current Outpatient Prescriptions  Medication Sig Dispense Refill  . ChlorproMAZINE HCl  (THORAZINE PO) Take 1 tablet by mouth daily.    Marland Kitchen HYDROcodone-acetaminophen (NORCO/VICODIN) 5-325 MG per tablet Take 1 tablet by mouth every 6 (six) hours as needed for moderate pain. 15 tablet 0  . HYDROcodone-acetaminophen (NORCO/VICODIN) 5-325 MG per tablet Take 1 tablet by mouth every 6 (six) hours as needed. (Patient not taking: Reported on 10/08/2014) 16 tablet 0  . PRESCRIPTION MEDICATION Take 1 tablet by mouth 2 (two) times daily. Seizure med #1    . PRESCRIPTION MEDICATION Take 1 tablet by mouth 2 (two) times daily. Seizure med #2    . PRESCRIPTION MEDICATION Take 1 tablet by mouth daily. adhd medication    . PRESCRIPTION MEDICATION Take 1 tablet by mouth daily. Haldol      Musculoskeletal: Strength & Muscle Tone: within normal limits Gait & Station: normal Patient leans: N/A  Psychiatric Specialty Exam: Physical Exam  Review of Systems  Psychiatric/Behavioral: Positive for depression and hallucinations. The patient is nervous/anxious.     Blood pressure 123/73, pulse 68, temperature 98 F (36.7 C), temperature source Oral, resp. rate 20, SpO2 100 %.There is no weight on file to calculate BMI.  General Appearance: Bizarre and Disheveled  Eye Contact::  Fair  Speech:  Clear and Coherent and Normal Rate  Volume:  Increased  Mood:  Anxious and Irritable  Affect:  Inappropriate and Labile  Thought Process:  Disorganized, Irrelevant and Tangential  Orientation:  Other:  Self  Thought Content:  Delusions and Hallucinations: Auditory Visual  Suicidal Thoughts:  Yes.  without intent/plan  Homicidal Thoughts:  No  Memory:  Immediate;   Fair Recent;   Fair Remote;   Fair  Judgement:  Impaired  Insight:  Lacking  Psychomotor Activity:  Increased  Concentration:  Fair  Recall:  [employer] of Knowledge:Fair  Language: Fair  Akathisia:  No  Handed:    AIMS (if indicated):     Assets:  Desire for Improvement Resilience  ADL's:  Impaired  Cognition: WNL  Sleep:       Medical Decision Making: Review of Psycho-Social Stressors (1), Review or order clinical lab tests (1) and Established Problem, Worsening (2)  Treatment Plan Summary: Schizophrenia, unspecified type, unstable, treated with Ativan, Geodon, and Risperdal with dose titration continually monitored based on pt's evolving condition.   Plan:  Recommend psychiatric Inpatient admission when medically cleared.  Disposition:  -Seek inpatient admission for safety and psychiatric stabilization.   Benjamine Mola, FNP-BC 10/09/2014 12:47 PM Patient seen face-to-face for psychiatric evaluation, chart reviewed and case discussed with the physician extender and developed treatment plan. Reviewed the information documented and agree with the treatment plan. Corena Pilgrim, MD

## 2014-10-09 NOTE — BHH Counselor (Signed)
BHH Assessment Progress Note  Patient information faxed for inpatient consideration to [facility], [facility], [facility], [place] [facility], [facility].     [place]. Ladona Ridgel, MS, NCC Disposition Counselor

## 2014-10-10 MED ORDER — TRAZODONE HCL 100 MG PO TABS: 100.0000 mg | ORAL_TABLET | Freq: Every day | ORAL | Status: DC

## 2014-10-10 MED ORDER — AMANTADINE HCL 100 MG PO CAPS: 100.0000 mg | ORAL_CAPSULE | Freq: Two times a day (BID) | ORAL | Status: DC

## 2014-10-10 MED ORDER — RISPERIDONE 2 MG PO TABS: 1.0000 mg | ORAL_TABLET | Freq: Two times a day (BID) | ORAL | Status: DC

## 2014-10-10 NOTE — ED Notes (Signed)
Dr wentz into see 

## 2014-10-10 NOTE — BHH Suicide Risk Assessment (Cosign Needed)
Suicide Risk Assessment  Discharge Assessment   [facility] Discharge Suicide Risk Assessment   Demographic Factors:  Male, Low socioeconomic status and Unemployed  Total Time spent with patient: 20 minutes  Musculoskeletal: Strength & Muscle Tone: within normal limits Gait & Station: normal Patient leans: N/A  Psychiatric Specialty Exam:     Blood pressure 138/109, pulse 104, temperature 97.9 F (36.6 C), temperature source Oral, resp. rate 16, SpO2 100 %.There is no weight on file to calculate BMI.  General Appearance: Casual  Eye Contact::  Good  Speech:  Clear and Coherent and Normal Rate  Volume:  Normal  Mood:  Anxious  Affect:  Congruent  Thought Process:  Coherent, Goal Directed and Intact  Orientation:  Other:  Self  Thought Content:  WDL  Suicidal Thoughts:  No  Homicidal Thoughts:  No  Memory:  Immediate;   Fair Recent;   Fair Remote;   Fair  Judgement:  Fair  Insight:  Fair  Psychomotor Activity:  Normal  Concentration:  Fair  Recall:  [employer] of Knowledge:Fair  Language: Good  Akathisia:  No  Handed:    AIMS (if indicated):     Assets:  Desire for Improvement Resilience  ADL's:  Intact  Cognition: WNL        Has this patient used any form of tobacco in the last 30 days? (Cigarettes, Smokeless Tobacco, Cigars, and/or Pipes) Yes, A prescription for an FDA-approved tobacco cessation medication was offered at discharge and the patient refused  Mental Status Per Nursing Assessment::   On Admission:     Current Mental Status by Physician: NA  Loss Factors: NA  Historical Factors: NA  Risk Reduction Factors:   Religious beliefs about death and Living with another person, especially a relative  Continued Clinical Symptoms:  Bipolar Disorder:   Depressive phase Depression:   Insomnia  Cognitive Features That Contribute To Risk:  Closed-mindedness and Polarized thinking    Suicide Risk:  Minimal: No identifiable suicidal ideation.  Patients  presenting with no risk factors but with morbid ruminations; may be classified as minimal risk based on the severity of the depressive symptoms  Principal Problem: Schizophrenia, unspecified type Discharge Diagnoses:  Patient Active Problem List   Diagnosis Date Noted  . Suicidal ideation [R45.851] 10/09/2014  . Schizophrenia, unspecified type [F20.9]     Follow-up Information    Call Use resources left for you to find a family doctor.   Why:  As needed   Contact information:   A List of self pay/low cost family doctors and [place] resources was left for you in your room on 10/09/14       Plan Of Care/Follow-up recommendations:  Activity:  as tolearted Diet:  regular  Is patient on multiple antipsychotic therapies at discharge:  No   Has Patient had three or more failed trials of antipsychotic monotherapy by history:  No  Recommended Plan for Multiple Antipsychotic Therapies: NA    Ayssa Bentivegna C   PMHNP-BC 10/10/2014, 11:56 AM

## 2014-10-10 NOTE — ED Notes (Signed)
Up in the shower
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
## 2014-10-10 NOTE — ED Notes (Signed)
IVC has been rescinded 

## 2014-10-10 NOTE — ED Notes (Addendum)
Written dc instructions reviewed w/ pt.  Pt encouraged to follow up with his OP provider and take his medications as directed.  Rx x3 reviewed with patient. Pt verbalized understanding.  Pt has declined to stay and have his sutures removed at this time.  Pt encouraged to have suture take out as soon as possible and verbalized underestanding.   Pt ambulatory w/o difficulty to dc window w/ mHt, belongings returned after leaving the area.

## 2014-10-10 NOTE — BHH Counselor (Signed)
Patient to be discharged home per Dr. [name] and [name], NP. Patient given follow up referrals to follow up with [facility]. Patient also given the # to mobile crises. Writer informed patient's nurse Wille Celeste that patient is ready for discharge.

## 2014-10-10 NOTE — ED Notes (Signed)
Up in room waiting for dc

## 2014-10-13 ENCOUNTER — Emergency Department (HOSPITAL_COMMUNITY)
Admission: EM | Admit: 2014-10-13 | Discharge: 2014-10-14 | Disposition: A | Discharge status: Home / Self Care | Payer: Self-pay | Attending: Emergency Medicine | Admitting: Emergency Medicine

## 2014-10-13 ENCOUNTER — Encounter (HOSPITAL_COMMUNITY): Payer: Self-pay | Admitting: Physical Medicine and Rehabilitation

## 2014-10-13 DIAGNOSIS — Y9289 Other specified places as the place of occurrence of the external cause: Secondary | ICD-10-CM | POA: Insufficient documentation

## 2014-10-13 DIAGNOSIS — F23 Brief psychotic disorder: Secondary | ICD-10-CM

## 2014-10-13 DIAGNOSIS — Y9389 Activity, other specified: Secondary | ICD-10-CM | POA: Insufficient documentation

## 2014-10-13 DIAGNOSIS — S41012A Laceration without foreign body of left shoulder, initial encounter: Secondary | ICD-10-CM | POA: Insufficient documentation

## 2014-10-13 DIAGNOSIS — F209 Schizophrenia, unspecified: Secondary | ICD-10-CM | POA: Diagnosis present

## 2014-10-13 DIAGNOSIS — Y998 Other external cause status: Secondary | ICD-10-CM | POA: Insufficient documentation

## 2014-10-13 DIAGNOSIS — Z720 Tobacco use: Secondary | ICD-10-CM | POA: Insufficient documentation

## 2014-10-13 DIAGNOSIS — F1910 Other psychoactive substance abuse, uncomplicated: Secondary | ICD-10-CM | POA: Diagnosis present

## 2014-10-13 DIAGNOSIS — F1994 Other psychoactive substance use, unspecified with psychoactive substance-induced mood disorder: Secondary | ICD-10-CM | POA: Diagnosis present

## 2014-10-13 DIAGNOSIS — X58XXXA Exposure to other specified factors, initial encounter: Secondary | ICD-10-CM | POA: Insufficient documentation

## 2014-10-13 DIAGNOSIS — IMO0002 Reserved for concepts with insufficient information to code with codable children: Secondary | ICD-10-CM

## 2014-10-13 DIAGNOSIS — F319 Bipolar disorder, unspecified: Secondary | ICD-10-CM | POA: Insufficient documentation

## 2014-10-13 LAB — CBC WITH DIFFERENTIAL/PLATELET
Basophils Absolute: 0 10*3/uL (ref 0.0–0.1)
Basophils Relative: 1 % (ref 0–1)
Eosinophils Absolute: 0 10*3/uL (ref 0.0–0.7)
Eosinophils Relative: 1 % (ref 0–5)
HCT: 41.2 % (ref 39.0–52.0)
Hemoglobin: 13.9 g/dL (ref 13.0–17.0)
Lymphocytes Relative: 37 % (ref 12–46)
Lymphs Abs: 1.7 10*3/uL (ref 0.7–4.0)
MCH: 28 pg (ref 26.0–34.0)
MCHC: 33.7 g/dL (ref 30.0–36.0)
MCV: 82.9 fL (ref 78.0–100.0)
MONOS PCT: 14 % — AB (ref 3–12)
Monocytes Absolute: 0.6 10*3/uL (ref 0.1–1.0)
NEUTROS ABS: 2.2 10*3/uL (ref 1.7–7.7)
Neutrophils Relative %: 47 % (ref 43–77)
Platelets: 290 10*3/uL (ref 150–400)
RBC: 4.97 MIL/uL (ref 4.22–5.81)
RDW: 14.7 % (ref 11.5–15.5)
WBC: 4.7 10*3/uL (ref 4.0–10.5)

## 2014-10-13 LAB — COMPREHENSIVE METABOLIC PANEL
ALT: 28 U/L (ref 17–63)
AST: 24 U/L (ref 15–41)
Albumin: 3.9 g/dL (ref 3.5–5.0)
Alkaline Phosphatase: 24 U/L — ABNORMAL LOW (ref 38–126)
Anion gap: 10 (ref 5–15)
BUN: 11 mg/dL (ref 6–20)
CALCIUM: 9.5 mg/dL (ref 8.9–10.3)
CO2: 25 mmol/L (ref 22–32)
CREATININE: 1.09 mg/dL (ref 0.61–1.24)
Chloride: 104 mmol/L (ref 101–111)
GFR calc Af Amer: >60 mL/min (ref >60)
Glucose, Bld: 80 mg/dL (ref 65–99)
POTASSIUM: 4.1 mmol/L (ref 3.5–5.1)
Sodium: 139 mmol/L (ref 135–145)
TOTAL PROTEIN: 8 g/dL (ref 6.5–8.1)
Total Bilirubin: 0.7 mg/dL (ref 0.3–1.2)

## 2014-10-13 LAB — ETHANOL: ALCOHOL ETHYL (B): 78 mg/dL — AB (ref <5)

## 2014-10-13 LAB — ACETAMINOPHEN LEVEL

## 2014-10-13 MED ORDER — SODIUM BICARBONATE 4 % IV SOLN
5.0000 mL | Freq: Once | INTRAVENOUS | Status: AC
  Administered 2014-10-14: 5 mL via INTRAVENOUS
  Filled 2014-10-13: qty 5

## 2014-10-13 MED ORDER — LORAZEPAM 1 MG PO TABS
2.0000 mg | ORAL_TABLET | Freq: Once | ORAL | Status: AC
  Administered 2014-10-13: 2 mg via ORAL
  Filled 2014-10-13: qty 2

## 2014-10-13 MED ORDER — OLANZAPINE 10 MG PO TBDP: 20.0000 mg | ORAL_TABLET | Freq: Every day | ORAL | Status: DC

## 2014-10-13 MED ORDER — OLANZAPINE 5 MG PO TBDP
20.0000 mg | ORAL_TABLET | Freq: Once | ORAL | Status: AC
  Administered 2014-10-13: 20 mg via ORAL
  Filled 2014-10-13: qty 2

## 2014-10-13 MED ORDER — LIDOCAINE-EPINEPHRINE (PF) 2 %-1:200000 IJ SOLN
10.0000 mL | Freq: Once | INTRAMUSCULAR | Status: AC
  Administered 2014-10-14: 10 mL via INTRADERMAL
  Filled 2014-10-13: qty 20

## 2014-10-13 MED ORDER — OXYCODONE HCL 5 MG PO TABS
5.0000 mg | ORAL_TABLET | Freq: Once | ORAL | Status: AC
  Administered 2014-10-13: 5 mg via ORAL
  Filled 2014-10-13: qty 1

## 2014-10-13 NOTE — ED Notes (Signed)
Per secretary Selena Batten, pt came out to the desk demanding something else for pain. This RN went into room to assure him that something else was being prescribed for him, and the pt stated "You don't give a fuck about me, I'll having you hoppin like someone into Santa's lap". Security and [employer] at bedside.
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
## 2014-10-13 NOTE — ED Notes (Signed)
Pt states that the pain medication given to him is not helping, that his pain is a 15. Pt states "I'm mad" when voicing that he is still having pain.

## 2014-10-13 NOTE — ED Notes (Signed)
Pt refusing vitals. [employer] bedside.

## 2014-10-13 NOTE — ED Notes (Signed)
Patient is resting comfortably. 
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
## 2014-10-14 ENCOUNTER — Emergency Department (HOSPITAL_COMMUNITY): Payer: Self-pay

## 2014-10-14 DIAGNOSIS — F1994 Other psychoactive substance use, unspecified with psychoactive substance-induced mood disorder: Secondary | ICD-10-CM | POA: Diagnosis present

## 2014-10-14 DIAGNOSIS — F1910 Other psychoactive substance abuse, uncomplicated: Secondary | ICD-10-CM | POA: Diagnosis present

## 2014-10-14 DIAGNOSIS — F23 Brief psychotic disorder: Secondary | ICD-10-CM | POA: Insufficient documentation

## 2014-10-14 DIAGNOSIS — F209 Schizophrenia, unspecified: Secondary | ICD-10-CM

## 2014-10-14 DIAGNOSIS — F29 Unspecified psychosis not due to a substance or known physiological condition: Secondary | ICD-10-CM

## 2014-10-14 MED ORDER — STERILE WATER FOR INJECTION IJ SOLN
INTRAMUSCULAR | Status: AC
  Filled 2014-10-14: qty 10

## 2014-10-14 MED ORDER — IBUPROFEN 400 MG PO TABS
400.0000 mg | ORAL_TABLET | Freq: Once | ORAL | Status: DC
  Filled 2014-10-14: qty 1

## 2014-10-14 MED ORDER — ACETAMINOPHEN 325 MG PO TABS
650.0000 mg | ORAL_TABLET | Freq: Once | ORAL | Status: AC
  Administered 2014-10-14: 650 mg via ORAL
  Filled 2014-10-14: qty 2

## 2014-10-14 MED ORDER — ZIPRASIDONE MESYLATE 20 MG IM SOLR
20.0000 mg | Freq: Once | INTRAMUSCULAR | Status: AC
  Administered 2014-10-14: 20 mg via INTRAMUSCULAR
  Filled 2014-10-14: qty 20

## 2014-10-14 MED ORDER — HALOPERIDOL LACTATE 5 MG/ML IJ SOLN
5.0000 mg | Freq: Four times a day (QID) | INTRAMUSCULAR | Status: DC | PRN
  Filled 2014-10-14: qty 1

## 2014-10-14 MED ORDER — LORAZEPAM 1 MG PO TABS
1.0000 mg | ORAL_TABLET | Freq: Three times a day (TID) | ORAL | Status: DC | PRN
  Filled 2014-10-14: qty 1
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
## 2014-10-14 NOTE — ED Provider Notes (Signed)
Patient was seen in the ED for psychotic behavior. He had laceration that was repaired here in the ED. He was making remarks that he was given a slit his throat. He was IVC by the prior physician team. Early this morning he became very violent and aggressive and was given Geodon.  He was reassessed by behavioral health who feels that this behavior is typical for the patient. At this point he is calm and cooperative. He did get agitated when I refused to give him ongoing narcotic medications. However this point he is relatively calm and cooperative. He's been assessed by behavioral Health who feels that he is safe for discharge home. They have contacted his cousin who lives next door to him he will watch out for him. I have given resources for outpatient follow-up. I also advised him that he is to follow-up with urgent care for staple removal in one week.  Rolan Bucco, MD 10/14/14 [phone]

## 2014-10-14 NOTE — ED Notes (Signed)
Pt not willing to receive shot of Geodon, stating "You can't give me a medication that I don't want". This RN and [employer] explained to the pt that he has been IVC, and he cannot refuse treatment. Pt stated "Fine, give me the shot, but I'm gonna destroy some more shit". Pt accepted the shot of Geodon. Pt still verbally abusing staff. [employer] and security remains at bedside.
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
## 2014-10-14 NOTE — ED Notes (Signed)
Pt called out stating that he wants to go home. This RN informed the pt that he is unable to go home at this time, that he needs to be observed over night. Pt informs this RN that she cannot keep him here, that it is against the law, and this RN informed the pt that he had been involuntarily committed. Pt becomes agitated and belligerent, sitting up in bed and taking off the monitor leads and attempting to take out his IV. Security called to the bedside.

## 2014-10-14 NOTE — ED Notes (Signed)
Pt has been changed into paper scrubs, and his belongings taken to the nurse's station. Staffing called for sitter, but told that there is no one until 7AM. Searching for a staff sitter.

## 2014-10-14 NOTE — BH Assessment (Signed)
Received notification of TTS consult request. Spoke to Dr. Lyndal Pulley who said Pt has history of schizophrenia and has been yelling, disorganized and threatening to harm ED staff and himself. Tele-assessment will be initiated.  Harlin Rain Patsy [place], LPC, [facility], [facility] Triage Specialist [phone]

## 2014-10-14 NOTE — ED Notes (Signed)
Pt is being verbally deescalated by Grace Isaac, RN. Pt is beginning to calm down, and has agreed to sit on the bed.

## 2014-10-14 NOTE — Discharge Instructions (Signed)
Psychosis Psychosis refers to a severe lack of understanding with reality. During a psychotic episode, you are not able to think clearly. During a psychotic episode, your responses and emotions are inappropriate and do not coincide with what is actually happening. You often have false beliefs about what is happening or who you are (delusions), and you may see, hear, taste, smell, or feel things that are not present (hallucinations). Psychosis is usually a severe symptom of a very serious mental health (psychiatric) condition, but it can sometimes be the result of a medical condition. CAUSES   Psychiatric conditions, such as:  Schizophrenia.  Bipolar disorder.  Depression.  Personality disorders.  Alcohol or drug abuse.  Medical conditions, such as:  Brain injury.  Brain tumor.  Dementia.  Brain diseases, such as Alzheimer's, Parkinson's, or Huntington's disease.  Neurological diseases, such as epilepsy.  Genetic disorders.  Metabolic disorders.  Infections that affect the brain.  Certain prescription drugs.  Stroke. SYMPTOMS   Unable to think or speak clearly or respond appropriately.  Disorganized thinking (thoughts jump from one thought to another).  Severe inappropriate behavior.  Delusions may include:  A strong belief that is odd, unrealistic, or false.  Feeling extremely fearful or suspicious (paranoid).  Believing you are someone else, have high importance, or have an altered identity.  Hallucinations. DIAGNOSIS   Mental health evaluation.  Physical exam.  Blood tests.  Computerized magnetic scan (MRI) or other brain scans. TREATMENT  Your caregiver will recommend a course of treatment that depends on the cause of the psychosis. Treatment may include:  Monitoring and supportive care in the hospital.  Taking medicines (antipsychotic medicine) to reduce symptoms and balance chemicals in the brain.  Taking medicines to manage underlying  mental health conditions.  Therapy and other supportive programs outside of the hospital.  Treating an underlying medical condition. If the cause of the psychosis can be treated or corrected, the outlook is good. Without treatment, psychotic episodes can cause danger to yourself or others. Treatment may be short-term or lifelong. HOME CARE INSTRUCTIONS   Take all medicines as directed. This is important.  Use a pillbox or write down your medicine schedule to make sure you are taking them.  Check with your caregiver before using over-the-counter medicines, herbs, or supplements.  Seek individual and family support through therapy and mental health education (psychoeducation) programs. These will help you manage symptoms and side effects of medicines, learn life skills, and maintain a healthy routine.  Maintain a healthy lifestyle.  Exercise regularly.  Avoid alcohol and drugs.  Learn ways to reduce stress and cope with stress, such as yoga and meditation.  Talk about your feelings with family members or caregivers.  Make time for yourself to do things you enjoy.  Know the early warning signs of psychosis. Your caregiver will recommend steps to take when you notice symptoms such as:  Feeling anxious or preoccupied.  Having racing thoughts.  Changes in your interest in life and relationships.  Follow up with your caregivers for continued outpatient treatment as directed. SEEK MEDICAL CARE IF:   Medicines do not seem to be helping.  You hear voices telling you to do things.  You see, smell, or feel things that are not there.  You feel hopeless and overwhelmed.  You feel extremely fearful and suspicious that something will harm you.  You feel like you cannot leave your house.  You have trouble taking care of yourself.  You experience side effects of medicines, such as  changes in sleep patterns, dizziness, weight gain, restlessness, movement changes, muscle spasms, or  tremors. SEEK IMMEDIATE MEDICAL CARE IF:  Severe psychotic symptoms present a safety issue (such as an urge to hurt yourself or others). MAKE SURE YOU:   Understand these instructions.  Will watch your condition.  Will get help right away if you are not doing well or get worse. FOR MORE INFORMATION  National Institute of Mental Health: http://www.maynard.net/ Document Released: 09/30/2009 Document Revised: 07/05/2011 Document Reviewed: 09/30/2009 [facility] Patient Information 2015 [place], [place]. This information is not intended to replace advice given to you by your health care provider. Make sure you discuss any questions you have with your health care provider. Emergency Department Resource Guide 1) Find a Doctor and Pay Out of Pocket Although you won't have to find out who is covered by your insurance plan, it is a good idea to ask around and get recommendations. You will then need to call the office and see if the doctor you have chosen will accept you as a new patient and what types of options they offer for patients who are self-pay. Some doctors offer discounts or will set up payment plans for their patients who do not have insurance, but you will need to ask so you aren't surprised when you get to your appointment.  2) Contact Your Local Health Department Not all health departments have doctors that can see patients for sick visits, but many do, so it is worth a call to see if yours does. If you don't know where your local health department is, you can check in your phone book. The CDC also has a tool to help you locate your state's health department, and many state websites also have listings of all of their local health departments.  3) Find a Walk-in Clinic If your illness is not likely to be very severe or complicated, you may want to try a walk in clinic. These are popping up all over the country in pharmacies, drugstores, and shopping centers. They're usually staffed by nurse  practitioners or physician assistants that have been trained to treat common illnesses and complaints. They're usually fairly quick and inexpensive. However, if you have serious medical issues or chronic medical problems, these are probably not your best option.   Chronic Pain Problems: Organization         Address     Phone             Notes  [facility]  [phone] Patients need to be referred by their primary care doctor.   Medication Assistance: Organization         Address     Phone             Notes  [facility] Medication [facility] [address] [place] [place], [place] [address] [phone] --Must be a resident of [facility] -- Must have NO insurance coverage whatsoever (no Medicaid/ Medicare, etc.) -- The pt. MUST have a primary care doctor that directs their care regularly and follows them in the community   MedAssist  [phone]   [employer]  [phone]    Agencies that provide inexpensive medical care: Organization         Address     Phone             Notes  [facility]  [phone]   [facility]    [phone]  [facility] [address] [place], [place] [address] [phone]   [facility] [address] [place] [place] [phone]   Planned Parenthood    [phone]   [facility]    [phone]   [facility]  [address], [place] Phone:  [phone], Fax:  [phone] Hours of Operation:  9 am - 6 pm, M-F.  Also accepts Medicaid/Medicare and self-pay.  [facility]  [address], [place] Phone: [phone], Fax: [phone]. Hours of Operation:  8:30 am - 5:30 pm, M-F.  Also accepts Medicaid and self-pay.  [facility] [place] [address], [place] Phone: [phone]   [facility]     [address] Natasha Bence [place], [place] [phone], Ext. [phone] Mondays & Thursdays: 7-9 AM.  First 15 patients are seen on a first come, first serve basis.   [facility] [address] [place] [address] [phone] Accepts Medicaid   Medicaid-accepting [facility] Providers:  Organization         Address     Phone             Notes  [facility] [address], [place] [phone] Also accepts self-pay patients.  [facility] [address] Laurell Josephs [place], [place]  [phone]   [facility] [address], [place] [phone]   [facility] [address], [place] [phone]   Renaye Rakers [address], [place]   [phone] Only accepts [place] Access [place] patients after they have their name applied to their card.   Self-Pay (no insurance) in [facility]:  Organization         Address     Phone             Notes  Sickle Cell Patients, [facility] [address] [place], [place] [phone]   [facility] Urgent Care [address] [place], [place] [phone]   [facility] [place]  [address], [place] [phone]   [facility]/Dr. Osei-Bonsu  [address], [place] or [address], [place] [phone] Phone number for both [place] and [place] locations is the same.  [facility] [address] [place] [phone]   [facility] [address] [place] or [address] [phone]   [facility] [address], [place] [phone], phone; [phone], fax Sees patients 1st and 3rd Saturday of every month.  Must not qualify for public or private insurance (i.e. Medicaid, Medicare, [place] Health Choice, Veterans' Benefits)  Household income  should be no more than 200% of the poverty level The clinic cannot treat you if you are pregnant or think you are pregnant  Sexually transmitted diseases are not treated at the clinic.    Dental Care:  Organization         Address     Phone             Notes  [facility] [address] [place], [place] [phone] Accepts children up to age 19 who are enrolled  in Medicaid or [place] Health Choice; pregnant women with a Medicaid card; and children who have applied for Medicaid or [place] Health Choice, but were declined, whose parents can pay a reduced fee at time of service.  [facility] Department of [facility]  [address], [place] [phone] Accepts children up to age 66 who are enrolled in [place] or [place] Health Choice; pregnant women with a Medicaid card; and children who have applied for Medicaid or [place] Health Choice, but were declined, whose parents can pay a reduced fee at time of service.  [facility]  [address] [place], [place] [phone] Patients are seen by appointment only. Walk-ins are not accepted. [facility] will see patients 88 years of age and older. Monday - Tuesday (8am-5pm) Most Wednesdays (8:30-5pm) $30 per visit, cash only  [facility] Adult Dental Access PROGRAM  [address], [facility] [phone] Patients are seen by appointment only. Walk-ins are not accepted. [facility] will see patients 79 years of age and older. One Wednesday Evening (Monthly: Volunteer Based).  $30 per visit, cash only  [employer]  [phone] for adults; Children under age 52, call Graduate Pediatric Dentistry at [phone]. Children aged 38-14, please call [phone] to request a pediatric application.  Dental services are provided in all areas of dental care including fillings, crowns and bridges, complete and partial dentures, implants,  gum treatment, root canals, and extractions. Preventive care is also provided. Treatment is provided to both adults and children. Patients are selected via a lottery and there is often a waiting list.   [facility] [address] [place]  [phone] [email or web address]   [facility] [address] [place], [place] [phone], Ext. [phone] Second and Fourth Thursday of each month, opens at 6:30 AM; Clinic ends at 9 AM.  Patients are seen on a first-come first-served basis, and a limited number are seen during each clinic.   [facility]  [address] Ether Griffins [place], [place] [phone]   Eligibility Requirements You must have lived in [place], [place], or [place] counties for at least the last three months.   You cannot be eligible for state or federal sponsored [place], including [employer], [place], or [employer].   You generally cannot be eligible for healthcare insurance through your employer.    How to apply: Eligibility screenings are held every Tuesday and Wednesday afternoon from 1:00 pm until 4:00 pm. You do not need an appointment for the interview!  [facility] [address], [place], [place] [phone]   [facility] Health Department  [phone]   [facility]  [phone]   [facility] Health Department  [phone]    Behavioral Health Resources in the Community: Intensive Outpatient Programs Organization         Address     Phone             Notes  [facility] [address], [place], [place] [phone]   [facility] Outpatient [address], [place], [place] [phone]   [facility] [address] [place], [place]  [phone]   [facility] Mental Health [address],  [place], [place] [phone] or [phone]     Substance Abuse Resources Organization         Address      Phone  Notes  Alcohol [facility] Drug Services  [phone]   [facility]  [phone]   [facility] [place]  ([phone])[phone]   Floydene Flock  [phone]   Residential & Outpatient Substance Abuse Program  [phone]   Psychological Services Organization         Address     Phone             Notes  [facility]  [phone]   [facility]  [phone]   [facility] Mental Health [address] [place] [phone] or [phone]    Mobile Crisis Teams Organization         Address     Phone             Notes  [facility], Mobile Crisis Care Unit  [phone]   [facility]  [address] [place], [place] [phone]   Doristine Locks [address] [place] [phone]    Self-Help/Support Groups Organization         Address     Phone             Notes  [facility] of [place] - variety of support groups  336- [phone] Call for more information  Narcotics Anonymous (NA), [facility] [address], [employer]   2 meetings at this location   Statistician         Address     Phone             Notes  [facility] [address],    [place]  [phone]   [facility]  [address] [place] [address], [place], [place] [phone]   [facility] [address] [place], [place] [phone] Admissions: 8am-3pm M-F  [facility] [address],    [place], [place] [phone]   [facility] [address] [place], [place], [place] [phone]   [facility] [address],  [place], [place] [phone]   [facility] - Intensive Outpatient [address] Laurell Josephs [address], [place], [place] [phone]   [facility] (Addiction Recovery Care [facility]oc[facility] [address] [place]  [place], [place] [phone] or [phone]   [facility]  (RTS) [address], [place], [place] [phone] Accepts Medicaid  [facility] [address]  [place] [phone] Substance Abuse/Addiction Treatment   [facility] Organization         Address     Phone             Notes  [facility]  [phone]   Angie Fava, PhD [address] [place] [address] Ervin Knack [place], [place]   [phone] or [phone]   [facility] Behavioral   [address] [place], [place] [phone]   [facility] [address] [place], [place] [phone] Insurance/Medicaid/sponsorship through [facility] and Families [address]                                    [place], [place] [phone] Therapy/tele-psych/case  [facility] [address], [place] [phone]    Dr. Lolly Mustache  [phone]   [facility] [place]  United [employer] [facility] Dept. 1) [address] [place] 2) [address], [place] 3)  [address], [place] [phone]  [phone]   [facility] Child Abuse Hotline (734)426-7194 or 870-174-4207 (After Hours)     [facility] Resources: Abuse and Neglect Organization         Address     Phone             Notes  Child/Elder Abuse Hotline  639-748-8633   Family Abuse Services  240-415-0976 24 hour crisis line  [facility]  (317)001-9145   [facility] Domestic Violence Hotline  [phone]    Behavioral Health & Substance Use Organization         Address     Phone             Notes  [employer]   814-455-6828 24 hour crisis line  [facility]  [address] [place] [phone] Monday- Friday, walk-in,  8am-8pm  RTSA Detoxification & Crisis Stabilization  (757)068-3039   Alcoholics Anonymous 510-479-4223 [employer]  Narcotics Anonymous  (865)791-9912     Health Clinics & Urgent  Care Centers Organization         Address     Phone             Notes  [facility] Health Department  [phone]   [facility]  [phone]   [facility]  [phone]   [facility]  [phone] Uninsured patients meeting eligibility requirement  [facility]      [facility]  [phone]      [facility]     Health Center  [phone]      [facility]  [phone]      [facility]   [phone]     [facility]  [phone]     Additional [facility] Resources Organization         Address     Phone             Notes  [facility]  [phone]   [place]  342-876-8115 Medicaid, Nutrition, Medicine Assistance, Utility Assistance  [facility] [phone]   [place] [facility]  (620)684-8946   [facility]  (340) 495-7420 [facility] Shelter  [facility] [place]  650-016-1527 Adult & family shelter, food, utility & rent assistance  24 Hour crisis line for those facing homelessness  204-421-1854   [facility] Transit  [phone] [place], [facility] public transportation system  Homecare Providers  315-474-8250 HIV/AIDS Case Management, FREE HIV SCREEN  Medication Management  213-834-8520 Ongoing medication assistance for patients meeting eligibility requirements  Medication Drop Box Locations: [place] Police Dept., [facility] Police Dept., [employer] Police Dept., [facility] office  Safely rid of unused medications  [employer]  3345028692 Crisis assistance, medication, housing, food, utility assistance  [employer] Info.  [facility])  [phone]

## 2014-10-14 NOTE — ED Notes (Signed)
Pt becoming increasingly agitated and continues to ask for pain medicine. MD Belfi notified. Pt refuses to allow [place] to take his vitals. Pt alert x4.
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
## 2014-10-14 NOTE — ED Notes (Addendum)
At time of discharge pt continued to ask for pain medication. Pt agitated but cooperative. Pts IVC has been rescinded. Pt will not sign discharge form. NAD at this time. Pt alert x4. Pt given bus pass. Pt would not allow [place] to take d/c vitals. Pt ambulatory at d/c.

## 2014-10-14 NOTE — ED Notes (Signed)
Pt alert x4. Pt calm and cooperative at this time.
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
## 2014-10-14 NOTE — ED Notes (Signed)
Pt has calmed down significantly, laying back in bed and following commands.

## 2014-10-14 NOTE — ED Notes (Signed)
TTS completed. 

## 2014-10-14 NOTE — ED Notes (Signed)
Pt is being transported to XRAY in presence of sitter.
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
## 2014-10-14 NOTE — ED Notes (Addendum)
Pt is resting comfortably, eating a [place] sandwich.

## 2014-10-14 NOTE — ED Notes (Signed)
Security and [employer] at bedside. 

## 2014-10-14 NOTE — ED Notes (Signed)
Pt is resting comfortable in bed.

## 2014-12-11 ENCOUNTER — Emergency Department (HOSPITAL_COMMUNITY)
Admission: EM | Admit: 2014-12-11 | Discharge: 2014-12-11 | Disposition: A | Discharge status: Home / Self Care | Payer: Self-pay | Attending: Emergency Medicine | Admitting: Emergency Medicine

## 2014-12-11 ENCOUNTER — Encounter (HOSPITAL_COMMUNITY): Payer: Self-pay | Admitting: Emergency Medicine

## 2014-12-11 DIAGNOSIS — Z720 Tobacco use: Secondary | ICD-10-CM | POA: Insufficient documentation

## 2014-12-11 DIAGNOSIS — F209 Schizophrenia, unspecified: Secondary | ICD-10-CM | POA: Insufficient documentation

## 2014-12-11 DIAGNOSIS — M79645 Pain in left finger(s): Secondary | ICD-10-CM | POA: Insufficient documentation

## 2014-12-11 DIAGNOSIS — Z79899 Other long term (current) drug therapy: Secondary | ICD-10-CM | POA: Insufficient documentation

## 2014-12-11 DIAGNOSIS — F319 Bipolar disorder, unspecified: Secondary | ICD-10-CM | POA: Insufficient documentation

## 2014-12-11 DIAGNOSIS — Z8781 Personal history of (healed) traumatic fracture: Secondary | ICD-10-CM | POA: Insufficient documentation

## 2014-12-11 MED ORDER — TRAMADOL HCL 50 MG PO TABS
50.0000 mg | ORAL_TABLET | Freq: Once | ORAL | Status: AC
  Administered 2014-12-11: 50 mg via ORAL
  Filled 2014-12-11: qty 1

## 2014-12-11 NOTE — ED Notes (Signed)
Pt request BP check because it was elevated when he donated plasma 2-3 months ago.  Also requests pain pills for L 5th digit.  States he was seen for fracture of same 2 months ago and took splint off because it hurt.  Pt is requesting 15 Vicodin.

## 2014-12-11 NOTE — Discharge Instructions (Signed)
Please follow-up with your doctors as needed. He may also follow-up with the [facility] in order to establish primary care. Return to ED for worsening symptoms.

## 2014-12-11 NOTE — ED Provider Notes (Signed)
History  This chart was scribed for non-physician practitioner, Mayme Genta, PA-C,working with Lavera Guise, MD, by Karle Plumber, ED Scribe. This patient was seen in room TR04C/TR04C and the patient's care was started at 5:14 PM.  Chief Complaint  Patient presents with  . BP check   . Medication Refill   The history is provided by the patient and medical records. No language interpreter was used.    HPI Comments:  Garrett Arellano is a 39 y.o. male who presents to the Emergency Department complaining of left fifth digit pain secondary to a fracture 2 months ago. Pt reports continued pain because he did not wear his splint as directed because it caused more pain. He denies modifying factors of the pain. He denies numbness, tingling or weakness of the left 5th digit or left hand, bruising, wounds or swelling. He requests Oxycodone for the pain.  He also reports being denied the chance to donate plasma due to a high blood pressure reading a couple months ago. He requests documentation that his BP is WNL so he may donate the plasma. He denies HA, dizziness, fever, chills, nausea or vomiting.   Past Medical History  Diagnosis Date  . Bipolar 1 disorder   . Schizophrenia   . Seizure    History reviewed. No pertinent past surgical history. No family history on file. Social History  Substance Use Topics  . Smoking status: Current Every Day Smoker    Types: Cigarettes  . Smokeless tobacco: Never Used  . Alcohol Use: Yes    Review of Systems  Constitutional: Negative for fever and chills.  Gastrointestinal: Negative for nausea and vomiting.  Musculoskeletal: Positive for arthralgias.  Skin: Negative for color change and wound.  Neurological: Negative for dizziness, weakness, numbness and headaches.  All other systems reviewed and are negative.   Allergies  Review of patient's allergies indicates no known allergies.  Home Medications   Prior to Admission medications   Medication  Sig Start Date End Date Taking? Authorizing Provider  amantadine (SYMMETREL) 100 MG capsule Take 1 capsule (100 mg total) by mouth 2 (two) times daily. 10/10/14   Earney Navy, NP  HYDROcodone-acetaminophen (NORCO/VICODIN) 5-325 MG per tablet TK 1 T PO Q 6 H PRN P 09/22/14   Historical Provider, MD  risperiDONE (RISPERDAL) 2 MG tablet Take 0.5 tablets (1 mg total) by mouth 2 (two) times daily. 10/10/14   Earney Navy, NP  traZODone (DESYREL) 100 MG tablet Take 1 tablet (100 mg total) by mouth at bedtime. 10/10/14   Earney Navy, NP   Triage Vitals: BP 138/84 mmHg  Pulse 77  Temp(Src) 98.5 F (36.9 C) (Oral)  Resp 16  Ht 6' (1.829 m)  Wt 189 lb (85.73 kg)  BMI 25.63 kg/m2  SpO2 100% Physical Exam  Constitutional: He is oriented to person, place, and time. He appears well-developed and well-nourished.  HENT:  Head: Normocephalic and atraumatic.  Eyes: EOM are normal.  Neck: Normal range of motion.  Cardiovascular: Normal rate, regular rhythm and normal heart sounds.   No murmur heard. Brisk cap refill  Pulmonary/Chest: Effort normal and breath sounds normal. No respiratory distress.  Abdominal: Soft. There is no tenderness.  Musculoskeletal: Normal range of motion.  Pain located to left fifth digit at DIP. No erythema. ROM grossly intact limited secondary to pain only.  Neurological: He is alert and oriented to person, place, and time.  Neurovascularly intact. Sensations intact to light touch.  Skin: Skin is  warm and dry.  Psychiatric: He has a normal mood and affect. His behavior is normal.  Nursing note and vitals reviewed.   ED Course  Procedures (including critical care time) DIAGNOSTIC STUDIES: Oxygen Saturation is 100% on RA, normal by my interpretation.   COORDINATION OF CARE: 5:18 PM- Will order a dose of Tramadol prior to discharge. Pt will be provided discharge paperwork with his vital printed on it for documentation requested. Pt verbalizes  understanding and agrees to plan.  Medications  traMADol (ULTRAM) tablet 50 mg (50 mg Oral Given 12/11/14 1722)    Labs Review Labs Reviewed - No data to display  Imaging Review No results found. I have personally reviewed and evaluated these images and lab results as part of my medical decision-making.   EKG Interpretation None     Filed Vitals:   12/11/14 1639  BP: 138/84  Pulse: 77  Temp: 98.5 F (36.9 C)  TempSrc: Oral  Resp: 16  Height: 6' (1.829 m)  Weight: 189 lb (85.73 kg)  SpO2: 100%    MDM  Vitals stable - WNL -afebrile Pt resting comfortably in ED. PE--physical exam is grossly benign. Patient here for chronic injury. No evidence of other acute or emergent pathology at this time. No evidence of septic joint. Given 1 tramadol in the ED. Discussed will not discharge with prescription for narcotic pain medicine. Patient verbalizes understanding.  I discussed all relevant lab findings and imaging results with pt and they verbalized understanding. Discussed f/u with PCP within 48 hrs and return precautions, pt very amenable to plan.  Final diagnoses:  Finger pain, left    I personally performed the services described in this documentation, which was scribed in my presence. The recorded information has been reviewed and is accurate.    Joycie Peek, PA-C 12/11/14 2027  Lavera Guise, MD 12/12/14 [phone]

## 2014-12-26 ENCOUNTER — Emergency Department (HOSPITAL_COMMUNITY)
Admission: EM | Admit: 2014-12-26 | Discharge: 2014-12-26 | Disposition: A | Discharge status: Home / Self Care | Payer: Self-pay | Attending: Physician Assistant | Admitting: Physician Assistant

## 2014-12-26 DIAGNOSIS — Z79899 Other long term (current) drug therapy: Secondary | ICD-10-CM | POA: Insufficient documentation

## 2014-12-26 DIAGNOSIS — Z0130 Encounter for examination of blood pressure without abnormal findings: Secondary | ICD-10-CM | POA: Insufficient documentation

## 2014-12-26 DIAGNOSIS — F319 Bipolar disorder, unspecified: Secondary | ICD-10-CM | POA: Insufficient documentation

## 2014-12-26 DIAGNOSIS — Z720 Tobacco use: Secondary | ICD-10-CM | POA: Insufficient documentation

## 2014-12-26 DIAGNOSIS — F209 Schizophrenia, unspecified: Secondary | ICD-10-CM | POA: Insufficient documentation

## 2014-12-26 NOTE — ED Notes (Signed)
Pt st's he went to donate plasma today but was told that he needed a note from MD stating that he was not hypertensive before he could donate.  Pt with no  complaints

## 2014-12-26 NOTE — ED Provider Notes (Signed)
CSN: [id number]     Arrival date & time 12/26/14  1655 History  This chart was scribed for non-physician practitioner, Eyvonne Mechanic, PA-C working with Abelino Derrick, MD by Doreatha Martin, ED scribe. This patient was seen in room TR07C/TR07C and the patient's care was started at 5:21 PM     Chief Complaint  Patient presents with  . Hypertension   The history is provided by the patient. No language interpreter was used.    HPI Comments: Garrett Arellano is a 39 y.o. male who presents to the Emergency Department requesting medical clearance to donate plasma. He notes that he went to [facility] to donate 2 days ago and was told that he could not donate due to his diastolic BP being above 90. Pt is not currently followed by a PCP. He is insured by Medicaid. Pt was seen in the ED on 12/11/14 for BP check and medication refill. He denies visual disturbance, CP, dysuria, difficulty urinating, frequency, abdominal pain, numbness, tingling, focal weakness, any other pains.   Past Medical History  Diagnosis Date  . Bipolar 1 disorder   . Schizophrenia   . Seizure    No past surgical history on file. No family history on file. Social History  Substance Use Topics  . Smoking status: Current Every Day Smoker    Types: Cigarettes  . Smokeless tobacco: Never Used  . Alcohol Use: Yes    Review of Systems  All other systems reviewed and are negative.  Allergies  Review of patient's allergies indicates no known allergies.  Home Medications   Prior to Admission medications   Medication Sig Start Date End Date Taking? Authorizing Provider  amantadine (SYMMETREL) 100 MG capsule Take 1 capsule (100 mg total) by mouth 2 (two) times daily. 10/10/14   Earney Navy, NP  HYDROcodone-acetaminophen (NORCO/VICODIN) 5-325 MG per tablet TK 1 T PO Q 6 H PRN P 09/22/14   Historical Provider, MD  risperiDONE (RISPERDAL) 2 MG tablet Take 0.5 tablets (1 mg total) by mouth 2 (two) times daily. 10/10/14    Earney Navy, NP  traZODone (DESYREL) 100 MG tablet Take 1 tablet (100 mg total) by mouth at bedtime. 10/10/14   Earney Navy, NP   BP 141/92 mmHg  Pulse 66  Temp(Src) 98.3 F (36.8 C) (Oral)  Resp 16  Ht 6' (1.829 m)  Wt 192 lb 6 oz (87.261 kg)  BMI 26.09 kg/m2  SpO2 100% Physical Exam  Constitutional: He is oriented to person, place, and time. He appears well-developed and well-nourished.  HENT:  Head: Normocephalic and atraumatic.  Eyes: Conjunctivae and EOM are normal. Pupils are equal, round, and reactive to light.  Neck: Normal range of motion. Neck supple.  Cardiovascular: Normal rate, regular rhythm and normal heart sounds.   Pulmonary/Chest: Effort normal and breath sounds normal. No respiratory distress.  Lungs CTA bilaterally.   Abdominal: He exhibits no distension.  Musculoskeletal: Normal range of motion.  Neurological: He is alert and oriented to person, place, and time.  Skin: Skin is warm and dry.  Psychiatric: He has a normal mood and affect. His behavior is normal.  Nursing note and vitals reviewed.  ED Course  Procedures (including critical care time) Labs Review Labs Reviewed - No data to display  Imaging Review No results found. I have personally reviewed and evaluated these images and lab results as part of my medical decision-making.   EKG Interpretation None      MDM  Final diagnoses:  Blood pressure check    Labs:  Imaging:  Consults:  Therapeutics:  Discharge Meds:   Assessment/Plan: Pt was advised to follow up with a PCP.       I personally performed the services described in this documentation, which was scribed in my presence. The recorded information has been reviewed and is accurate.  Eyvonne Mechanic, PA-C 12/26/14 1742    Eyvonne Mechanic, PA-C 12/26/14 1800  Courteney Randall An, MD 12/26/14 2116

## 2014-12-26 NOTE — ED Notes (Signed)
Pt requesting to be cleared to give plasma. Reports attempting to donate and was denied due to blood pressure. Pt denies all other complaints.

## 2014-12-26 NOTE — Discharge Instructions (Signed)
° °   ° °  Filed Vitals:   12/26/14 1713  BP: 141/92  Pulse: 66  Temp: 98.3 F (36.8 C)  Resp: 16    12/11/2014 Triage Vitals: BP 138/84 mmHg   Pulse 77   Temp(Src) 98.5 F (36.9 C) (Oral)   Resp 16   Ht 6' (1.829 m)   Wt 189 lb (85.73 kg)   BMI 25.63 kg/m2   SpO2 100%

## 2015-10-01 ENCOUNTER — Encounter (HOSPITAL_COMMUNITY): Payer: Self-pay | Admitting: Emergency Medicine

## 2015-10-01 ENCOUNTER — Emergency Department (HOSPITAL_COMMUNITY)
Admission: EM | Admit: 2015-10-01 | Discharge: 2015-10-02 | Disposition: A | Discharge status: Home / Self Care | Payer: Self-pay | Attending: Emergency Medicine | Admitting: Emergency Medicine

## 2015-10-01 DIAGNOSIS — F319 Bipolar disorder, unspecified: Secondary | ICD-10-CM | POA: Insufficient documentation

## 2015-10-01 DIAGNOSIS — F1910 Other psychoactive substance abuse, uncomplicated: Secondary | ICD-10-CM | POA: Insufficient documentation

## 2015-10-01 DIAGNOSIS — F17210 Nicotine dependence, cigarettes, uncomplicated: Secondary | ICD-10-CM | POA: Insufficient documentation

## 2015-10-01 DIAGNOSIS — R45851 Suicidal ideations: Secondary | ICD-10-CM | POA: Insufficient documentation

## 2015-10-01 NOTE — ED Notes (Signed)
Pt brought in by EMS in police custody after police say they saw him ingest something out of a plastic bag  Pt states it was gum and denies ingestion anything  EMS states pt is hypertensive and slightly tachycardic upon arrival

## 2015-10-02 ENCOUNTER — Emergency Department (HOSPITAL_COMMUNITY): Payer: Self-pay

## 2015-10-02 LAB — BASIC METABOLIC PANEL
ANION GAP: 5 (ref 5–15)
BUN: 15 mg/dL (ref 6–20)
CHLORIDE: 108 mmol/L (ref 101–111)
CO2: 28 mmol/L (ref 22–32)
Calcium: 9 mg/dL (ref 8.9–10.3)
Creatinine, Ser: 1.15 mg/dL (ref 0.61–1.24)
GFR calc Af Amer: >60 mL/min (ref >60)
Glucose, Bld: 116 mg/dL — ABNORMAL HIGH (ref 65–99)
POTASSIUM: 3.9 mmol/L (ref 3.5–5.1)
SODIUM: 141 mmol/L (ref 135–145)

## 2015-10-02 LAB — CBC WITH DIFFERENTIAL/PLATELET
BASOS PCT: 0 %
Basophils Absolute: 0 10*3/uL (ref 0.0–0.1)
EOS ABS: 0.1 10*3/uL (ref 0.0–0.7)
Eosinophils Relative: 2 %
HEMATOCRIT: 37.1 % — AB (ref 39.0–52.0)
Hemoglobin: 12.2 g/dL — ABNORMAL LOW (ref 13.0–17.0)
Lymphocytes Relative: 30 %
Lymphs Abs: 1.7 10*3/uL (ref 0.7–4.0)
MCH: 27.7 pg (ref 26.0–34.0)
MCHC: 32.9 g/dL (ref 30.0–36.0)
MCV: 84.3 fL (ref 78.0–100.0)
MONO ABS: 0.4 10*3/uL (ref 0.1–1.0)
MONOS PCT: 7 %
NEUTROS ABS: 3.4 10*3/uL (ref 1.7–7.7)
Neutrophils Relative %: 61 %
PLATELETS: 242 10*3/uL (ref 150–400)
RBC: 4.4 MIL/uL (ref 4.22–5.81)
RDW: 14.8 % (ref 11.5–15.5)
WBC: 5.7 10*3/uL (ref 4.0–10.5)

## 2015-10-02 LAB — RAPID URINE DRUG SCREEN, HOSP PERFORMED
AMPHETAMINES: NOT DETECTED
BENZODIAZEPINES: NOT DETECTED
Barbiturates: NOT DETECTED
Cocaine: POSITIVE — AB
OPIATES: NOT DETECTED
TETRAHYDROCANNABINOL: POSITIVE — AB

## 2015-10-02 LAB — ETHANOL: Alcohol, Ethyl (B): 196 mg/dL — ABNORMAL HIGH (ref <5)

## 2015-10-02 MED ORDER — RISPERIDONE 1 MG PO TABS
1.0000 mg | ORAL_TABLET | Freq: Once | ORAL | Status: AC
  Administered 2015-10-02: 1 mg via ORAL
  Filled 2015-10-02: qty 1

## 2015-10-02 MED ORDER — AMANTADINE HCL 100 MG PO CAPS
100.0000 mg | ORAL_CAPSULE | Freq: Two times a day (BID) | ORAL | Status: DC
  Administered 2015-10-02: 100 mg via ORAL
  Filled 2015-10-02 (×2): qty 1

## 2015-10-02 MED ORDER — ONDANSETRON 4 MG PO TBDP
4.0000 mg | ORAL_TABLET | Freq: Once | ORAL | Status: AC
  Administered 2015-10-02: 4 mg via ORAL
  Filled 2015-10-02: qty 1

## 2015-10-02 NOTE — Discharge Instructions (Signed)
Polysubstance Abuse °When people abuse more than one drug or type of drug it is called polysubstance or polydrug abuse. For example, many smokers also drink alcohol. This is one form of polydrug abuse. Polydrug abuse also refers to the use of a drug to counteract an unpleasant effect produced by another drug. It may also be used to help with withdrawal from another drug. People who take stimulants may become agitated. Sometimes this agitation is countered with a tranquilizer. This helps protect against the unpleasant side effects. Polydrug abuse also refers to the use of different drugs at the same time.  °Anytime drug use is interfering with normal living activities, it has become abuse. This includes problems with family and friends. Psychological dependence has developed when your mind tells you that the drug is needed. This is usually followed by physical dependence which has developed when continuing increases of drug are required to get the same feeling or "high". This is known as addiction or chemical dependency. A person's risk is much higher if there is a history of chemical dependency in the family. °SIGNS OF CHEMICAL DEPENDENCY °· You have been told by friends or family that drugs have become a problem. °· You fight when using drugs. °· You are having blackouts (not remembering what you do while using). °· You feel sick from using drugs but continue using. °· You lie about use or amounts of drugs (chemicals) used. °· You need chemicals to get you going. °· You are suffering in work performance or in school because of drug use. °· You get sick from use of drugs but continue to use anyway. °· You need drugs to relate to people or feel comfortable in social situations. °· You use drugs to forget problems. °"Yes" answered to any of the above signs of chemical dependency indicates there are problems. The longer the use of drugs continues, the greater the problems will become. °If there is a family history of  drug or alcohol use, it is best not to experiment with these drugs. Continual use leads to tolerance. After tolerance develops more of the drug is needed to get the same feeling. This is followed by addiction. With addiction, drugs become the most important part of life. It becomes more important to take drugs than participate in the other usual activities of life. This includes relating to friends and family. Addiction is followed by dependency. Dependency is a condition where drugs are now needed not just to get high, but to feel normal. °Addiction cannot be cured but it can be stopped. This often requires outside help and the care of professionals. Treatment centers are listed in the yellow pages under: Cocaine, Narcotics, and Alcoholics Anonymous. Most hospitals and clinics can refer you to a specialized care center. Talk to your caregiver if you need help. °  °This information is not intended to replace advice given to you by your health care provider. Make sure you discuss any questions you have with your health care provider. °  °Document Released: 12/02/2004 Document Revised: 07/05/2011 Document Reviewed: 04/17/2014 °Elsevier Interactive Patient Education ©2016 [employer] ° °

## 2015-10-02 NOTE — BH Assessment (Signed)
Assessment completed. Consulted Malachy Chamberakia Starkes, NP who agrees that pt does not meet inpatient criteria at this time. It is recommended that pt receive his medication while in the ED prior to being transported to jail. Pt is currently under arrest per [employer]. Pt will should be able to remain safe in a jail setting and could be placed under suicide watch if necessary. Informed Dr. Wilkie AyeHorton of the recommendation.

## 2015-10-02 NOTE — ED Provider Notes (Signed)
CSN: [id number]     Arrival date & time 10/01/15  2350 History  By signing my name below, I, [facility], attest that this documentation has been prepared under the direction and in the presence of Shon Baton, MD. Electronically Signed: Randell Patient, ED Scribe. 10/02/2015. 12:59 AM.   Chief Complaint  Patient presents with  . Ingestion    The history is provided by the patient and the police. No language interpreter was used.   HPI Comments: Garrett Arellano is a 40 y.o. male who presents to the Emergency Department after a possible ingestion. Pt states that he took Risperdal and candy earlier this evening and was walking down the street when he was stopped by police who escorted him to the ED to get checked out. He reports intermittent auditory hallucinations and SI tonight without a plan. Per [employer], pt was picked up off the street after a possible drug deal. Emergency planning/management officer states that he saw the pt speaking to another man, ended the conversation after his vehicle passed by, and was walking down the street when he lifted his hand to his mouth. He reports that the pt has a small plastic bag under his tongue that they have not attempted to retrieve. Denies illicit drug use. Denies access to firearms at home. Denies nausea, vomiting, CP, or any other complaints currently.Patient denies any ingestion. Denies specifically cocaine, marijuana, heroin use.  Patient states "I think I need to stay the night."  Past Medical History  Diagnosis Date  . Bipolar 1 disorder (HCC)   . Schizophrenia (HCC)   . Seizure [facility])    History reviewed. No pertinent past surgical history. History reviewed. No pertinent family history. Social History  Substance Use Topics  . Smoking status: Current Every Day Smoker    Types: Cigarettes  . Smokeless tobacco: Never Used  . Alcohol Use: Yes    Review of Systems  Cardiovascular: Negative for chest pain.  Gastrointestinal: Positive for nausea. Negative  for vomiting and diarrhea.  Psychiatric/Behavioral: Positive for suicidal ideas and hallucinations.  All other systems reviewed and are negative.     Allergies  Ibuprofen  Home Medications   Prior to Admission medications   Medication Sig Start Date End Date Taking? Authorizing Provider  amantadine (SYMMETREL) 100 MG capsule Take 1 capsule (100 mg total) by mouth 2 (two) times daily. 10/10/14  Yes Earney Navy, NP  risperiDONE (RISPERDAL) 2 MG tablet Take 0.5 tablets (1 mg total) by mouth 2 (two) times daily. 10/10/14  Yes Earney Navy, NP  traZODone (DESYREL) 100 MG tablet Take 1 tablet (100 mg total) by mouth at bedtime. 10/10/14  Yes Earney Navy, NP  HYDROcodone-acetaminophen (NORCO/VICODIN) 5-325 MG per tablet TK 1 T PO Q 6 H PRN P 09/22/14   Historical Provider, MD   BP 161/109 mmHg  Pulse 80  Temp(Src) 98.7 F (37.1 C) (Oral)  Resp 18  SpO2 99% Physical Exam  Constitutional: He is oriented to person, place, and time. No distress.  HENT:  Head: Normocephalic and atraumatic.  Mouth/Throat: Oropharynx is clear and moist.  Eyes: Pupils are equal, round, and reactive to light.  Cardiovascular: Normal rate, regular rhythm and normal heart sounds.   No murmur heard. Pulmonary/Chest: Effort normal and breath sounds normal. No respiratory distress. He has no wheezes.  Abdominal: Soft. Bowel sounds are normal. There is no tenderness. There is no rebound and no guarding.  Musculoskeletal: He exhibits no edema.  Neurological: He is alert  and oriented to person, place, and time.  Skin: Skin is warm and dry.  Nursing note and vitals reviewed.   ED Course  Procedures (including critical care time)  DIAGNOSTIC STUDIES: Oxygen Saturation is 100% on RA, normal by my interpretation.    COORDINATION OF CARE: 12:31 AM Will order abdominal x-ray and labs. Discussed treatment plan with pt at bedside and pt agreed to plan.   Labs Review Labs Reviewed  CBC WITH  DIFFERENTIAL/PLATELET - Abnormal; Notable for the following:    Hemoglobin 12.2 (*)    HCT 37.1 (*)    All other components within normal limits  BASIC METABOLIC PANEL - Abnormal; Notable for the following:    Glucose, Bld 116 (*)    All other components within normal limits  URINE RAPID DRUG SCREEN, HOSP PERFORMED - Abnormal; Notable for the following:    Cocaine POSITIVE (*)    Tetrahydrocannabinol POSITIVE (*)    All other components within normal limits  ETHANOL - Abnormal; Notable for the following:    Alcohol, Ethyl (B) 196 (*)    All other components within normal limits    Imaging Review Dg Abd 1 View  10/02/2015  CLINICAL DATA:  40 year old male with possible ingestion of foreign object. EXAM: ABDOMEN - 1 VIEW COMPARISON:  Radiograph dated 10/08/2014 FINDINGS: Copious amount of stool noted throughout the colon and rectosigmoid. There is no bowel dilatation or evidence of obstruction. No free air identified. No radiopaque calculi or foreign object seen. The osseous structures and the soft tissues are grossly unremarkable. IMPRESSION: No bowel obstruction or radiopaque foreign object. Electronically Signed   By: Elgie CollardArash  Radparvar M.D.   On: 10/02/2015 01:49   I have personally reviewed and evaluated these images and lab results as part of my medical decision-making.   EKG Interpretation   Date/Time:  Thursday October 02 2015 00:55:29 EDT Ventricular Rate:  78 PR Interval:  182 QRS Duration: 79 QT Interval:  382 QTC Calculation: 435 R Axis:   64 Text Interpretation:  Sinus rhythm ST elev, probable normal early repol  pattern Baseline wander in lead(s) II III aVF Confirmed by Lace Chenevert  MD,  Tionna Gigante ([id number]) on 10/02/2015 1:54:10 AM      MDM   Final diagnoses:  Polysubstance abuse  Suicidal ideations    Patient presents after possible ingestion. Reports SI and HI in the setting of being in police custody. Does have a history of schizophrenia and bipolar disorder. Nontoxic on  exam. Vital signs reassuring. EKG is reassuring. There is no radiopaque foreign body in the abdomen. He is positive for cocaine and marijuana. Patient adamantly denies any ingestion. He has been monitored without any signs of somnolence or vital sign derangements. Will have TTS evaluate given hallucinations and SI.  3:00AM Informed by TTS counselor that patient was too sleepy for assessment. On recheck, patient was asleep but arousable. Oriented. Pupils 6 mm and reactive. Normal respiratory rate. Patient states "I didn't know somebody was trying to talk to me." I have requested repeat evaluation. Suspect some element of the patient not cooperating.  6:15 AM [facility]. They recommend dosing patient's home medications. Patient is safe for discharge. He has been observed in the ER for greater than 5 hours. Has not exhibited any toxidromes. No signs of foreign body on x-ray. Does have evidence of alcohol intoxication and cocaine and marijuana in his system. Suspect that his claims of suicidality were likely related to his arrest. He will be discharged into  police custody.  After history, exam, and medical workup I feel the patient has been appropriately medically screened and is safe for discharge home. Pertinent diagnoses were discussed with the patient. Patient was given return precautions.   I personally performed the services described in this documentation, which was scribed in my presence. The recorded information has been reviewed and is accurate.    Shon Baton, MD 10/02/15 [phone]

## 2015-10-02 NOTE — BH Assessment (Signed)
Tele Assessment Note   Garrett Arellano is an 40 y.o. male presenting to [facility] after a suspected ingestion.  Pt is drowsy during this assessment and provides minimal responses to this Clinical research associate. Pt stated "I need my medicine". "I take Tegatrol, Thorazine and Haldol". Pt reported that he receives his medication through [facility]; however he was unable to provide any details regard the last time he took his medication or his last doctor's appointment. PT reported that he is suicidal with a plan to jump off of cliff and shared that he has attempted suicide in the past. This writer was unable to assess for HI or psychosis at this time due to pt drifting off to sleep and not responding to this Clinical research associate calling his name. Pt's BAL is 196 and his UDS is positive for cocaine and THC.   4098: Check in with pt and pt is endorsing homicidal ideations towards everyone and when asked how pt stated "I would use voodoo". Pt reported that he has access to weapons and stated "I have them here with me" and held up the eme bag and stated "this is one right here". Pt reported that he lives with his family; however appeared confused when this Clinical research associate asked about his cousin that lived down the street (previously documented in 2016). Pt is also reported command hallucinations telling him to kill, kill, kill. When pt was asked about drugs and alcohol pt stated "I don't know what you're talking about"  but stated "I drink gas". [facility]   Diagnosis: Cocaine use disorder; Cannabis use disorder, Alcohol use disorder, Schizophrenia   Past Medical History:  Past Medical History  Diagnosis Date  . Bipolar 1 disorder (HCC)   . Schizophrenia (HCC)   . Seizure [facility])     History reviewed. No pertinent past surgical history.  Family History: History reviewed. No pertinent family history.  Social History:  reports that he has been smoking Cigarettes.  He has never used smokeless  tobacco. He reports that he drinks alcohol. He reports that he does not use illicit drugs.  Additional Social History:  Alcohol / Drug Use History of alcohol / drug use?: Yes (BAL is 196 and UDS is positive for cocaine and THC. )  CIWA: CIWA-Ar BP: (!) 162/110 mmHg Pulse Rate: 82 COWS:    PATIENT STRENGTHS: (choose at least two) Average or above average intelligence General fund of knowledge  Allergies:  Allergies  Allergen Reactions  . Ibuprofen Swelling    Home Medications:  (Not in a hospital admission)  OB/GYN Status:  No LMP for male patient.  General Assessment Data Location of Assessment: [facility] TTS Assessment: In system Is this a Tele or Face-to-Face Assessment?: Face-to-Face Is this an Initial Assessment or a Re-assessment for this encounter?: Initial Assessment Marital status: Single Can pt return to current living arrangement?: Yes Admission Status: Voluntary Is patient capable of signing voluntary admission?: Yes Referral Source: Self/Family/Friend Insurance type: None      Crisis Care Plan Name of Psychiatrist: Vesta Mixer  Name of Therapist:  (unable to assess)  Education Status Is patient currently in school?: No  Risk to self with the past 6 months Suicidal Ideation: Yes-Currently Present Has patient been a risk to self within the past 6 months prior to admission? : No Suicidal Intent: Yes-Currently Present Has patient had any suicidal intent within the past 6 months prior to admission? : No Is patient at risk for suicide?:  Yes Suicidal Plan?: Yes-Currently Present Has patient had any suicidal plan within the past 6 months prior to admission? : No Specify Current Suicidal Plan: jump off a cliff Access to Means: No (put is currently under arrest. ) What has been your use of drugs/alcohol within the last 12 months?: BAL is 196. UDS is positive for cocaine and THC.  Previous Attempts/Gestures: Yes How many times?: 2 Other Self Harm Risks: Pt denies   Triggers for Past Attempts: None known Intentional Self Injurious Behavior: None Family Suicide History: Unknown Recent stressful life event(s):  (unable to assess) Persecutory voices/beliefs?:  (unable to assess) Depression:  (unable to assess) Depression Symptoms:  (unable to assess) Substance abuse history and/or treatment for substance abuse?: Yes  Risk to Others within the past 6 months Homicidal Ideation:  (unable to assess) Does patient have any lifetime risk of violence toward others beyond the six months prior to admission? : Unknown Thoughts of Harm to Others:  (unable to assess) Current Homicidal Intent:  (unable to assess) Current Homicidal Plan:  (unable to assess) Access to Homicidal Means:  (unable to assess) Identified Victim:  (unable to assess) History of harm to others?:  (unable to assess) Assessment of Violence: None Noted Violent Behavior Description: No violent behaviors observed at this time.  Does patient have access to weapons?:  (unable to assess) Criminal Charges Pending?: Yes Describe Pending Criminal Charges:  (per [employer] pt is currently under arrest. ) Is patient on probation?: Unknown  Psychosis Hallucinations:  (unable to assess) Delusions:  (unable to assess)  Mental Status Report Appearance/Hygiene: In hospital gown Eye Contact: Poor Motor Activity: Unable to assess Speech: Soft Level of Consciousness: Sleeping Mood: Euthymic Affect: Appropriate to circumstance Anxiety Level:  (unable to assess) Thought Processes: Unable to Assess Judgement: Unable to Assess Orientation: Unable to assess Obsessive Compulsive Thoughts/Behaviors: Unable to Assess  Cognitive Functioning Concentration: Unable to Assess Memory: Unable to Assess IQ: Average Insight: Unable to Assess Impulse Control: Unable to Assess Appetite:  (unable to assess) Weight Loss:  (unable to assess) Weight Gain:  (unable to assess) Sleep: Unable to Assess Vegetative  Symptoms: Unable to Assess  ADLScreening [facility] Assessment Services) Patient's cognitive ability adequate to safely complete daily activities?: Yes Patient able to express need for assistance with ADLs?: Yes Independently performs ADLs?: Yes (appropriate for developmental age)  Prior Inpatient Therapy Prior Inpatient Therapy:  (unable to assess)  Prior Outpatient Therapy Prior Outpatient Therapy:  (unable to assess) Does patient have an ACCT team?: Unknown Does patient have Intensive In-House Services?  : No Does patient have [facility] services? : Yes Does patient have P4CC services?: Unknown  ADL Screening (condition at time of admission) Patient's cognitive ability adequate to safely complete daily activities?: Yes Is the patient deaf or have difficulty hearing?: No Does the patient have difficulty seeing, even when wearing glasses/contacts?: No Does the patient have difficulty concentrating, remembering, or making decisions?: No Patient able to express need for assistance with ADLs?: Yes Does the patient have difficulty dressing or bathing?: No Independently performs ADLs?: Yes (appropriate for developmental age) Does the patient have difficulty walking or climbing stairs?: No       Abuse/Neglect Assessment (Assessment to be complete while patient is alone) Physical Abuse:  (unable to assess ) Verbal Abuse:  (unable to assess) Sexual Abuse:  (unable to assess) Exploitation of patient/patient's resources:  (unable to assess)     Advance Directives (For Healthcare) Does patient have an advance directive?: No Would patient like information  on creating an advanced directive?: No - patient declined information    Additional Information 1:1 In Past 12 Months?: No CIRT Risk: No Elopement Risk: No Does patient have medical clearance?: Yes     Disposition: Pt does not meet inpatient criteria at this time.  It is recommended that pt be given his medication prior to being  transported to jail.  Disposition Initial Assessment Completed for this Encounter: Yes  Obie Silos S 10/02/2015 3:07 AM

## 2016-04-24 ENCOUNTER — Encounter (HOSPITAL_COMMUNITY): Payer: Self-pay | Admitting: Emergency Medicine

## 2016-04-24 ENCOUNTER — Emergency Department (HOSPITAL_COMMUNITY)
Admission: EM | Admit: 2016-04-24 | Discharge: 2016-04-26 | Disposition: A | Discharge status: Home / Self Care | Payer: Self-pay | Attending: Emergency Medicine | Admitting: Emergency Medicine

## 2016-04-24 DIAGNOSIS — F1910 Other psychoactive substance abuse, uncomplicated: Secondary | ICD-10-CM

## 2016-04-24 DIAGNOSIS — Z79899 Other long term (current) drug therapy: Secondary | ICD-10-CM | POA: Insufficient documentation

## 2016-04-24 DIAGNOSIS — F10929 Alcohol use, unspecified with intoxication, unspecified: Secondary | ICD-10-CM

## 2016-04-24 DIAGNOSIS — F1920 Other psychoactive substance dependence, uncomplicated: Secondary | ICD-10-CM | POA: Diagnosis present

## 2016-04-24 DIAGNOSIS — F17210 Nicotine dependence, cigarettes, uncomplicated: Secondary | ICD-10-CM | POA: Insufficient documentation

## 2016-04-24 DIAGNOSIS — F1010 Alcohol abuse, uncomplicated: Secondary | ICD-10-CM | POA: Diagnosis present

## 2016-04-24 DIAGNOSIS — F250 Schizoaffective disorder, bipolar type: Secondary | ICD-10-CM | POA: Diagnosis present

## 2016-04-24 DIAGNOSIS — F10129 Alcohol abuse with intoxication, unspecified: Secondary | ICD-10-CM | POA: Insufficient documentation

## 2016-04-24 DIAGNOSIS — F1994 Other psychoactive substance use, unspecified with psychoactive substance-induced mood disorder: Secondary | ICD-10-CM | POA: Diagnosis present

## 2016-04-24 LAB — ETHANOL: Alcohol, Ethyl (B): 222 mg/dL — ABNORMAL HIGH (ref <5)

## 2016-04-24 LAB — COMPREHENSIVE METABOLIC PANEL
ALBUMIN: 4.5 g/dL (ref 3.5–5.0)
ALK PHOS: 26 U/L — AB (ref 38–126)
ALT: 24 U/L (ref 17–63)
ANION GAP: 9 (ref 5–15)
AST: 39 U/L (ref 15–41)
BILIRUBIN TOTAL: 0.7 mg/dL (ref 0.3–1.2)
BUN: 11 mg/dL (ref 6–20)
CALCIUM: 8.9 mg/dL (ref 8.9–10.3)
CO2: 25 mmol/L (ref 22–32)
Chloride: 107 mmol/L (ref 101–111)
Creatinine, Ser: 1.04 mg/dL (ref 0.61–1.24)
GFR calc Af Amer: >60 mL/min (ref >60)
GFR calc non Af Amer: >60 mL/min (ref >60)
GLUCOSE: 108 mg/dL — AB (ref 65–99)
Potassium: 4.1 mmol/L (ref 3.5–5.1)
SODIUM: 141 mmol/L (ref 135–145)
TOTAL PROTEIN: 7.6 g/dL (ref 6.5–8.1)

## 2016-04-24 LAB — ACETAMINOPHEN LEVEL: Acetaminophen (Tylenol), Serum: <10 ug/mL — ABNORMAL LOW (ref 10–30)

## 2016-04-24 LAB — CBC
HCT: 41.1 % (ref 39.0–52.0)
HEMOGLOBIN: 14.1 g/dL (ref 13.0–17.0)
MCH: 27.7 pg (ref 26.0–34.0)
MCHC: 34.3 g/dL (ref 30.0–36.0)
MCV: 80.7 fL (ref 78.0–100.0)
PLATELETS: 259 10*3/uL (ref 150–400)
RBC: 5.09 MIL/uL (ref 4.22–5.81)
RDW: 13 % (ref 11.5–15.5)
WBC: 7.4 10*3/uL (ref 4.0–10.5)

## 2016-04-24 LAB — SALICYLATE LEVEL

## 2016-04-24 MED ORDER — STERILE WATER FOR INJECTION IJ SOLN
INTRAMUSCULAR | Status: AC
Start: 2016-04-24 — End: 2016-04-25
  Filled 2016-04-24: qty 10

## 2016-04-24 MED ORDER — LORAZEPAM 2 MG/ML IJ SOLN
2.0000 mg | Freq: Once | INTRAMUSCULAR | Status: AC
  Administered 2016-04-24: 2 mg via INTRAMUSCULAR
  Filled 2016-04-24: qty 1

## 2016-04-24 MED ORDER — DIPHENHYDRAMINE HCL 50 MG/ML IJ SOLN
25.0000 mg | Freq: Once | INTRAMUSCULAR | Status: AC
  Administered 2016-04-24: 25 mg via INTRAMUSCULAR
  Filled 2016-04-24: qty 1

## 2016-04-24 MED ORDER — STERILE WATER FOR INJECTION IJ SOLN
INTRAMUSCULAR | Status: AC
  Administered 2016-04-24: 2 mL
  Filled 2016-04-24: qty 10

## 2016-04-24 MED ORDER — ZIPRASIDONE MESYLATE 20 MG IM SOLR
INTRAMUSCULAR | Status: AC
Start: 2016-04-24 — End: 2016-04-25
  Filled 2016-04-24: qty 20

## 2016-04-24 MED ORDER — ZIPRASIDONE MESYLATE 20 MG IM SOLR
20.0000 mg | Freq: Once | INTRAMUSCULAR | Status: AC
  Administered 2016-04-24: 20 mg via INTRAMUSCULAR
  Filled 2016-04-24: qty 20

## 2016-04-24 NOTE — ED Notes (Signed)
Patient states he is suicidal-[employer] remains at bedside

## 2016-04-24 NOTE — ED Triage Notes (Signed)
Patient brought in by [employer]. [employer] states called out by patient's cousin because he was yelling and screaming in the parking lot. Patient is loud and spitting on the floor.

## 2016-04-24 NOTE — ED Notes (Signed)
Unable to draw labs to to pt being aggressive and uncooperative. Will try at a later time.
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
## 2016-04-24 NOTE — ED Notes (Signed)
[facility] Expected date:  Expected time:  Means of arrival:  Comments: [employer] intoxicated

## 2016-04-24 NOTE — BH Assessment (Signed)
Attempted to assess patient. Patient was un-arousable due to receiving Geodon. Patient also in restraints. Patient IVC states:  Patient with hx of schizophrenia and bipolar disorder presenting for suicidal ideations. Patient stating "I want to kill myself.: He has tangential speech. Aggressive toward staff. Requiring handcuffs for safety of staff. Concern for harm to self + others.    Davina PokeJoVea Kenna Kirn, LCSW Therapeutic Triage Specialist [facility] 04/24/2016 11:10 PM

## 2016-04-25 DIAGNOSIS — F250 Schizoaffective disorder, bipolar type: Secondary | ICD-10-CM | POA: Diagnosis present

## 2016-04-25 DIAGNOSIS — F1920 Other psychoactive substance dependence, uncomplicated: Secondary | ICD-10-CM | POA: Diagnosis present

## 2016-04-25 LAB — RAPID URINE DRUG SCREEN, HOSP PERFORMED
AMPHETAMINES: NOT DETECTED
Barbiturates: NOT DETECTED
Benzodiazepines: NOT DETECTED
COCAINE: POSITIVE — AB
OPIATES: NOT DETECTED
TETRAHYDROCANNABINOL: POSITIVE — AB

## 2016-04-25 MED ORDER — LOPERAMIDE HCL 2 MG PO CAPS: 2.0000 mg | ORAL_CAPSULE | ORAL | Status: DC | PRN

## 2016-04-25 MED ORDER — LORAZEPAM 1 MG PO TABS: 1.0000 mg | ORAL_TABLET | Freq: Four times a day (QID) | ORAL | Status: DC | PRN

## 2016-04-25 MED ORDER — VITAMIN B-1 100 MG PO TABS: 100.0000 mg | ORAL_TABLET | Freq: Every day | ORAL | Status: DC

## 2016-04-25 MED ORDER — OLANZAPINE 10 MG PO TBDP: 10.0000 mg | ORAL_TABLET | Freq: Three times a day (TID) | ORAL | Status: DC | PRN

## 2016-04-25 MED ORDER — ONDANSETRON 4 MG PO TBDP: 4.0000 mg | ORAL_TABLET | Freq: Four times a day (QID) | ORAL | Status: DC | PRN

## 2016-04-25 MED ORDER — HYDROXYZINE HCL 25 MG PO TABS: 25.0000 mg | ORAL_TABLET | Freq: Four times a day (QID) | ORAL | Status: DC | PRN

## 2016-04-25 MED ORDER — LORAZEPAM 1 MG PO TABS: 1.0000 mg | ORAL_TABLET | Freq: Every day | ORAL | Status: DC

## 2016-04-25 MED ORDER — ADULT MULTIVITAMIN W/MINERALS CH: 1.0000 | ORAL_TABLET | Freq: Every day | ORAL | Status: DC

## 2016-04-25 MED ORDER — ZIPRASIDONE MESYLATE 20 MG IM SOLR
INTRAMUSCULAR | Status: AC
  Filled 2016-04-25: qty 20

## 2016-04-25 MED ORDER — RISPERIDONE 1 MG PO TABS
1.0000 mg | ORAL_TABLET | Freq: Two times a day (BID) | ORAL | Status: DC
  Filled 2016-04-25: qty 1

## 2016-04-25 MED ORDER — LORAZEPAM 1 MG PO TABS: 0.0000 mg | ORAL_TABLET | Freq: Four times a day (QID) | ORAL | Status: DC

## 2016-04-25 MED ORDER — THIAMINE HCL 100 MG/ML IJ SOLN: 100.0000 mg | Freq: Every day | INTRAMUSCULAR | Status: DC

## 2016-04-25 MED ORDER — LORAZEPAM 2 MG/ML IJ SOLN: 2.0000 mg | Freq: Once | INTRAMUSCULAR | Status: DC

## 2016-04-25 MED ORDER — LORAZEPAM 2 MG/ML IJ SOLN: 1.0000 mg | Freq: Four times a day (QID) | INTRAMUSCULAR | Status: DC | PRN

## 2016-04-25 MED ORDER — FOLIC ACID 1 MG PO TABS
1.0000 mg | ORAL_TABLET | Freq: Every day | ORAL | Status: DC
  Administered 2016-04-26: 1 mg via ORAL
  Filled 2016-04-25: qty 1

## 2016-04-25 MED ORDER — THIAMINE HCL 100 MG/ML IJ SOLN: 100.0000 mg | Freq: Once | INTRAMUSCULAR | Status: DC

## 2016-04-25 MED ORDER — AMANTADINE HCL 100 MG PO CAPS
100.0000 mg | ORAL_CAPSULE | Freq: Two times a day (BID) | ORAL | Status: DC
  Filled 2016-04-25: qty 1

## 2016-04-25 MED ORDER — ZIPRASIDONE MESYLATE 20 MG IM SOLR: 20.0000 mg | Freq: Once | INTRAMUSCULAR | Status: DC

## 2016-04-25 MED ORDER — DIPHENHYDRAMINE HCL 50 MG/ML IJ SOLN
INTRAMUSCULAR | Status: AC
  Filled 2016-04-25: qty 1

## 2016-04-25 MED ORDER — DIPHENHYDRAMINE HCL 50 MG/ML IJ SOLN: 50.0000 mg | Freq: Once | INTRAMUSCULAR | Status: DC

## 2016-04-25 MED ORDER — LORAZEPAM 1 MG PO TABS: 1.0000 mg | ORAL_TABLET | Freq: Four times a day (QID) | ORAL | Status: DC

## 2016-04-25 MED ORDER — LORAZEPAM 1 MG PO TABS: 0.0000 mg | ORAL_TABLET | Freq: Two times a day (BID) | ORAL | Status: DC

## 2016-04-25 MED ORDER — ADULT MULTIVITAMIN W/MINERALS CH
1.0000 | ORAL_TABLET | Freq: Every day | ORAL | Status: DC
  Administered 2016-04-26: 1 via ORAL
  Filled 2016-04-25: qty 1

## 2016-04-25 MED ORDER — LORAZEPAM 1 MG PO TABS: 1.0000 mg | ORAL_TABLET | Freq: Two times a day (BID) | ORAL | Status: DC

## 2016-04-25 MED ORDER — LORAZEPAM 2 MG/ML IJ SOLN
INTRAMUSCULAR | Status: AC
  Filled 2016-04-25: qty 1

## 2016-04-25 MED ORDER — VITAMIN B-1 100 MG PO TABS
100.0000 mg | ORAL_TABLET | Freq: Every day | ORAL | Status: DC
  Administered 2016-04-26: 100 mg via ORAL
  Filled 2016-04-25: qty 1

## 2016-04-25 MED ORDER — TRAZODONE HCL 100 MG PO TABS
100.0000 mg | ORAL_TABLET | Freq: Every day | ORAL | Status: DC
  Administered 2016-04-25: 100 mg via ORAL
  Filled 2016-04-25: qty 1

## 2016-04-25 MED ORDER — LORAZEPAM 1 MG PO TABS: 1.0000 mg | ORAL_TABLET | Freq: Three times a day (TID) | ORAL | Status: DC

## 2016-04-25 NOTE — ED Notes (Signed)
Report to include Situation, Background, Assessment, and Recommendations received from LuAnn RN. Patient alert and oriented, warm and dry, in no acute distress. Patient denies SI, HI, AVH and pain. Patient made aware of Q15 minute rounds and security cameras for their safety. Patient instructed to come to me with needs or concerns.  

## 2016-04-25 NOTE — ED Notes (Signed)
Hourly rounding reveals patient sleeping in room. No complaints, stable, in no acute distress. Q15 minute rounds and monitoring via Security Cameras to continue. 

## 2016-04-25 NOTE — ED Notes (Signed)
Hourly rounding reveals patient in room. No complaints, stable, in no acute distress. Q15 minute rounds and monitoring via Security Cameras to continue. 

## 2016-04-25 NOTE — ED Notes (Signed)
Pt. Transferred to [facility] from ED to room after screening for contraband. Report to include Situation, Background, Assessment and Recommendations from [employer] [employer] RN. Pt. Oriented to unit including Q15 minute rounds as well as the security cameras for their protection. Patient is alert and oriented, warm and dry in no acute distress. Patient states he has SI without a plan at this time, HI toward people in general, and hears voices telling him to "kill people" . Pt. Encouraged to let me know if needs arise.

## 2016-04-25 NOTE — ED Notes (Signed)
Patient has been cooperative this shift. Appropriate conversations on the phone. No physical threats. Continue 15' checks for sfatey.
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
## 2016-04-25 NOTE — Progress Notes (Signed)
Patient has been referred to the following inpatient treatment facilities: [facility], [facility], [facility], [facility], [facility], [facility].  [place] Abtsatia Edee Nifong, LCSWA Disposition staff 04/25/2016 9:29 AM

## 2016-04-26 DIAGNOSIS — F17210 Nicotine dependence, cigarettes, uncomplicated: Secondary | ICD-10-CM

## 2016-04-26 DIAGNOSIS — Z79899 Other long term (current) drug therapy: Secondary | ICD-10-CM

## 2016-04-26 DIAGNOSIS — F1920 Other psychoactive substance dependence, uncomplicated: Secondary | ICD-10-CM

## 2016-04-26 DIAGNOSIS — Z888 Allergy status to other drugs, medicaments and biological substances status: Secondary | ICD-10-CM

## 2016-04-26 DIAGNOSIS — F250 Schizoaffective disorder, bipolar type: Secondary | ICD-10-CM

## 2016-04-26 DIAGNOSIS — F1010 Alcohol abuse, uncomplicated: Secondary | ICD-10-CM | POA: Diagnosis present

## 2016-04-26 MED ORDER — AMANTADINE HCL 100 MG PO CAPS: 100.0000 mg | ORAL_CAPSULE | Freq: Two times a day (BID) | ORAL | 0 refills | Status: DC

## 2016-04-26 MED ORDER — HYDROXYZINE HCL 25 MG PO TABS: 25.0000 mg | ORAL_TABLET | Freq: Four times a day (QID) | ORAL | 0 refills | Status: DC | PRN

## 2016-04-26 MED ORDER — TRAZODONE HCL 100 MG PO TABS: 100.0000 mg | ORAL_TABLET | Freq: Every day | ORAL | 0 refills | Status: DC

## 2016-04-26 MED ORDER — RISPERIDONE 1 MG PO TABS: 1.0000 mg | ORAL_TABLET | Freq: Two times a day (BID) | ORAL | 0 refills | Status: DC
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
## 2016-04-26 NOTE — ED Notes (Signed)
Snack given.

## 2016-04-26 NOTE — ED Notes (Signed)
Hourly rounding reveals patient sleeping in room. No complaints, stable, in no acute distress. Q15 minute rounds and monitoring via Security Cameras to continue. 

## 2016-04-26 NOTE — ED Notes (Addendum)
Written dc instructions and prescriptions reviewed with pt.  Pt encouraged to take medications as directed and follow up w/ his OP provider and OP medical for recheck of his blood pressure.  Pt also encouraged to avoid ETOH and drugs.  Pt denies si/hi/avh on dc and was encouraged to seek treatment for any changes or recurrance.  Pt verbalized understanding of instructions.  Pt ambulatory w/o difficulty to dc area w/ mHt, belongings returned after leaving the area.

## 2016-04-26 NOTE — ED Notes (Signed)
IVC rescinded per DNP

## 2016-04-26 NOTE — BH Assessment (Signed)
[facility] Assessment Progress Note  Per Thedore MinsMojeed Akintayo, MD, this pt does not require psychiatric hospitalization at this time.  Pt presents under IVC initiated by EDP Chaney Mallingavid Yao, MD, which Dr Jannifer FranklinAkintayo has rescinded.  [facility] with recommendation to follow up with area substance abuse treatment providers.  This has been included in pt's discharge instructions.  Pt's nurse, Wille CelesteJanie, has been notified.  Doylene Canninghomas Ikran Patman, MA Triage Specialist [phone]

## 2016-04-26 NOTE — ED Notes (Signed)
Dr Lenore CordiaAkintyo and Catha NottinghamJamison DNP into see.  Pt denies si,hi,avh at this time and reports that he is ready to go home.

## 2016-04-26 NOTE — [facility] Suicide Risk Assessment (Signed)
Suicide Risk Assessment  Discharge Assessment   [facility]   Principal Problem: Schizoaffective disorder, bipolar type [facility](HCC) Discharge Diagnoses:  Patient Active Problem List   Diagnosis Date Noted  . Alcohol abuse [F10.10] 04/26/2016    Priority: High  . 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) [F19.20] 04/25/2016    Priority: High  . Schizoaffective disorder, bipolar type (HCC) [F25.0] 04/25/2016    Priority: High  . Polysubstance abuse [F19.10] 10/14/2014  . Acute psychosis [F23]   . Suicidal ideation [R45.851] 10/09/2014    Total Time spent with patient: 45 minutes Musculoskeletal: Strength & Muscle Tone: within normal limits Gait & Station: normal Patient leans: N/A  Psychiatric Specialty Exam: Physical Exam  Constitutional: He is oriented to person, place, and time. He appears well-developed and well-nourished.  HENT:  Head: Normocephalic.  Neck: Normal range of motion.  Respiratory: Effort normal.  Musculoskeletal: Normal range of motion.  Neurological: He is alert and oriented to person, place, and time.  Psychiatric: He has a normal mood and affect. His speech is normal and behavior is normal. Judgment and thought content normal. Cognition and memory are normal.    Review of Systems  Psychiatric/Behavioral: Positive for substance abuse.  All other systems reviewed and are negative.   Blood pressure 140/98, pulse 80, temperature 97.8 F (36.6 C), temperature source Oral, resp. rate 18, SpO2 95 %.There is no height or weight on file to calculate BMI.  General Appearance: Casual  Eye Contact:  Good  Speech:  Normal Rate  Volume:  Normal  Mood:  Euthymic  Affect:  Congruent  Thought Process:  Coherent and Descriptions of Associations: Intact  Orientation:  Full (Time, Place, and Person)  Thought Content:  WDL and Logical  Suicidal Thoughts:  No  Homicidal Thoughts:  No  Memory:  Immediate;    Good Recent;   Good Remote;   Good  Judgement:  Fair  Insight:  Fair  Psychomotor Activity:  Normal  Concentration:  Concentration: Good and Attention Span: Good  Recall:  Good  Fund of Knowledge:  Fair  Language:  Good  Akathisia:  No  Handed:  Right  AIMS (if indicated):     Assets:  Housing Leisure Time Physical Health Resilience Social Support  ADL's:  Intact  Cognition:  WNL  Sleep:       Mental Status Per Nursing Assessment::   On Admission:   Alcohol and cocaine abuse with homicidal/suicidal ideations  Demographic Factors:  Male  Loss Factors: NA  Historical Factors: NA  Risk Reduction Factors:   Sense of responsibility to family, Living with another person, especially a relative and Positive social support  Continued Clinical Symptoms:  None  Cognitive Features That Contribute To Risk:  None    Suicide Risk:  Minimal: No identifiable suicidal ideation.  Patients presenting with no risk factors but with morbid ruminations; may be classified as minimal risk based on the severity of the depressive symptoms    Plan Of Care/Follow-up recommendations:  Activity:  as tolerated Diet:  heart healthy diet  LORD, JAMISON, NP 04/26/2016, 10:01 AM

## 2016-04-26 NOTE — ED Notes (Signed)
Up to the bathroom to shower and change  

## 2016-04-26 NOTE — Discharge Instructions (Signed)
To help you maintain a sober lifestyle, a substance abuse treatment program may be beneficial to you.  Contact one of the following facilities at your earliest opportunity to ask about enrolling: ° °RESIDENTIAL PROGRAMS: ° °     [facility] °     [address] °     [place], [place] [address] °     [phone] ° °     [facility] °     [address] °     [place], [place] [address] °     [phone] ° °     [facility] °     [address] °     [place], [place] [address] °     [phone] ° °OUTPATIENT PROGRAMS: ° °     [facility] °     [address] °     [place], [place] [address] °     [phone] °     New patients are seen at the walk-in clinic every Tuesday from 9:00 am - 12:00 pm. °

## 2016-04-26 NOTE — ED Notes (Signed)
Patient said since Garrett RogueRn Gary had got his blood pressure last night he was refusing this morning. Rn [name] was made aware of refusal.

## 2016-06-15 IMAGING — DX DG FINGER LITTLE 2+V*L*
3 series · 3 of 3 positions shown · non-contrast
Comparison: None.

CLINICAL DATA: Jamming type injury fifth digit with pain

EXAM:
LEFT FIFTH FINGER 2+V

[finger ap]
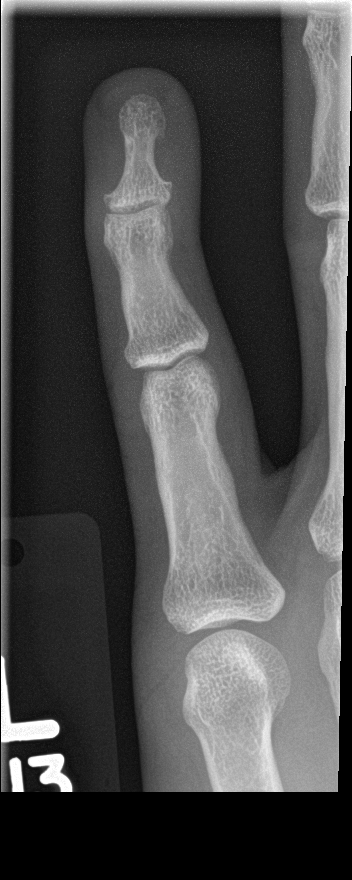

[finger obl]
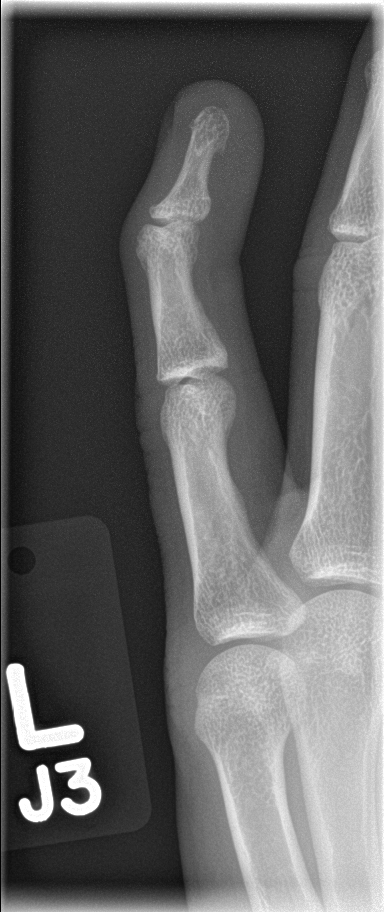

[finger lat]
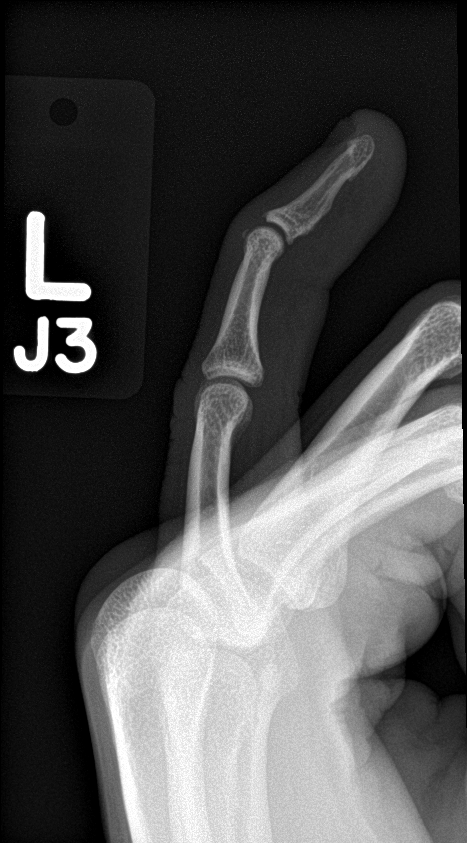

[3 of 3 positions shown; findings below may reference images not displayed]

FINDINGS: Frontal, oblique, and lateral views obtained. There is a small
avulsion arising from the dorsal aspect of the distal aspect of the
fifth middle phalanx. No other evidence fracture. No dislocation.
Joint spaces appear intact. No erosive change.
IMPRESSION: Small avulsion arising the dorsal aspect of the distal aspect of the
fifth middle phalanx. No other fracture. No dislocation. No
appreciable arthropathy.

## 2016-06-27 IMAGING — CR DG ABDOMEN ACUTE W/ 1V CHEST
3 series · 3 of 3 positions shown · non-contrast
Comparison: 09/22/2014 chest radiograph.

CLINICAL DATA: Suicidal ideation.  Ingested foreign bodies.

EXAM:
DG ABDOMEN ACUTE W/ 1V CHEST

[x chest ap]
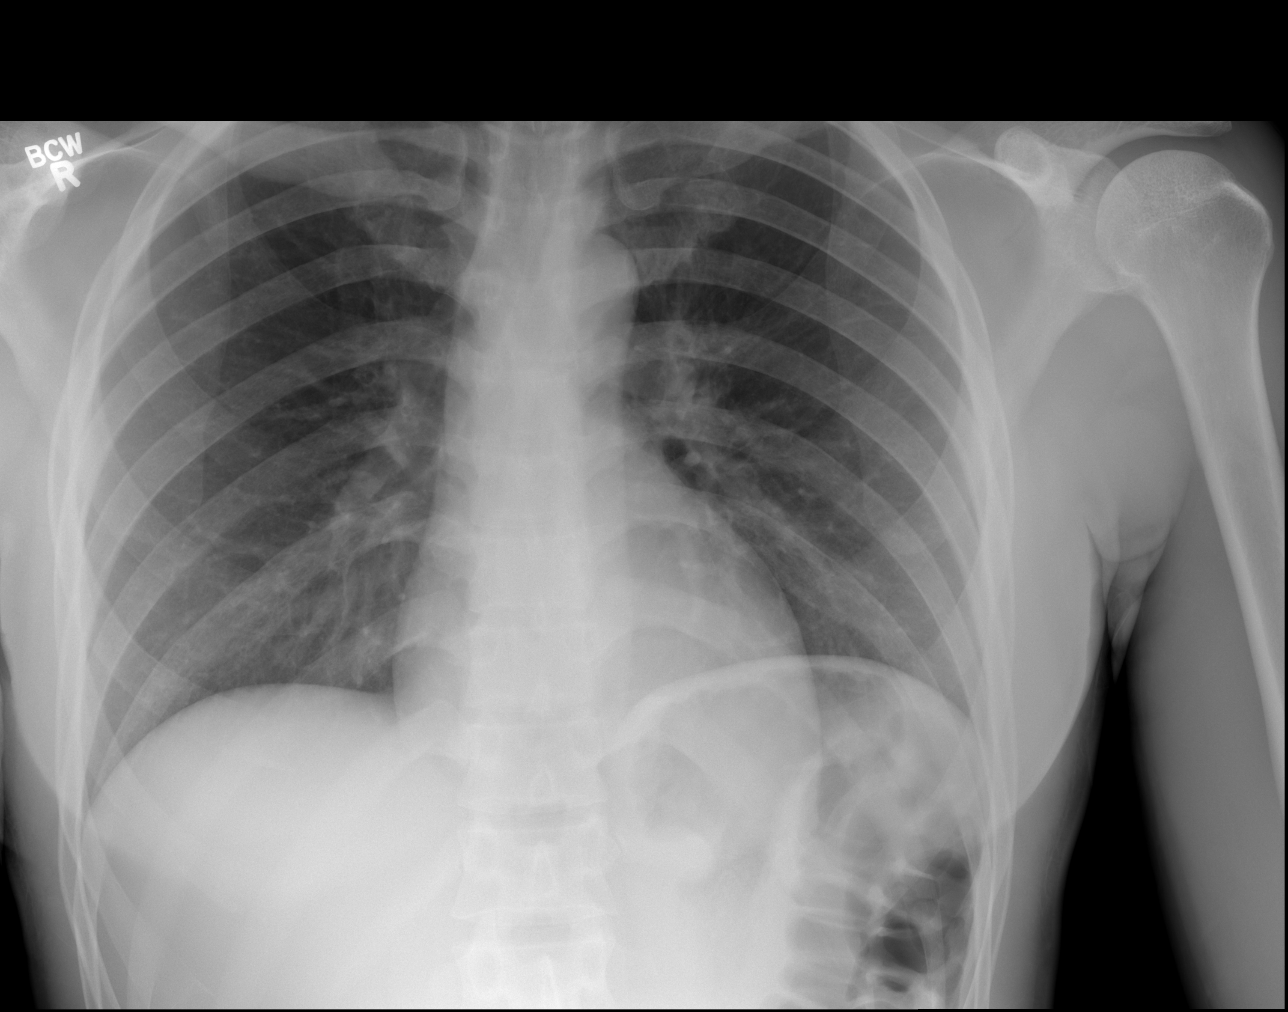

[x abdomen supine]
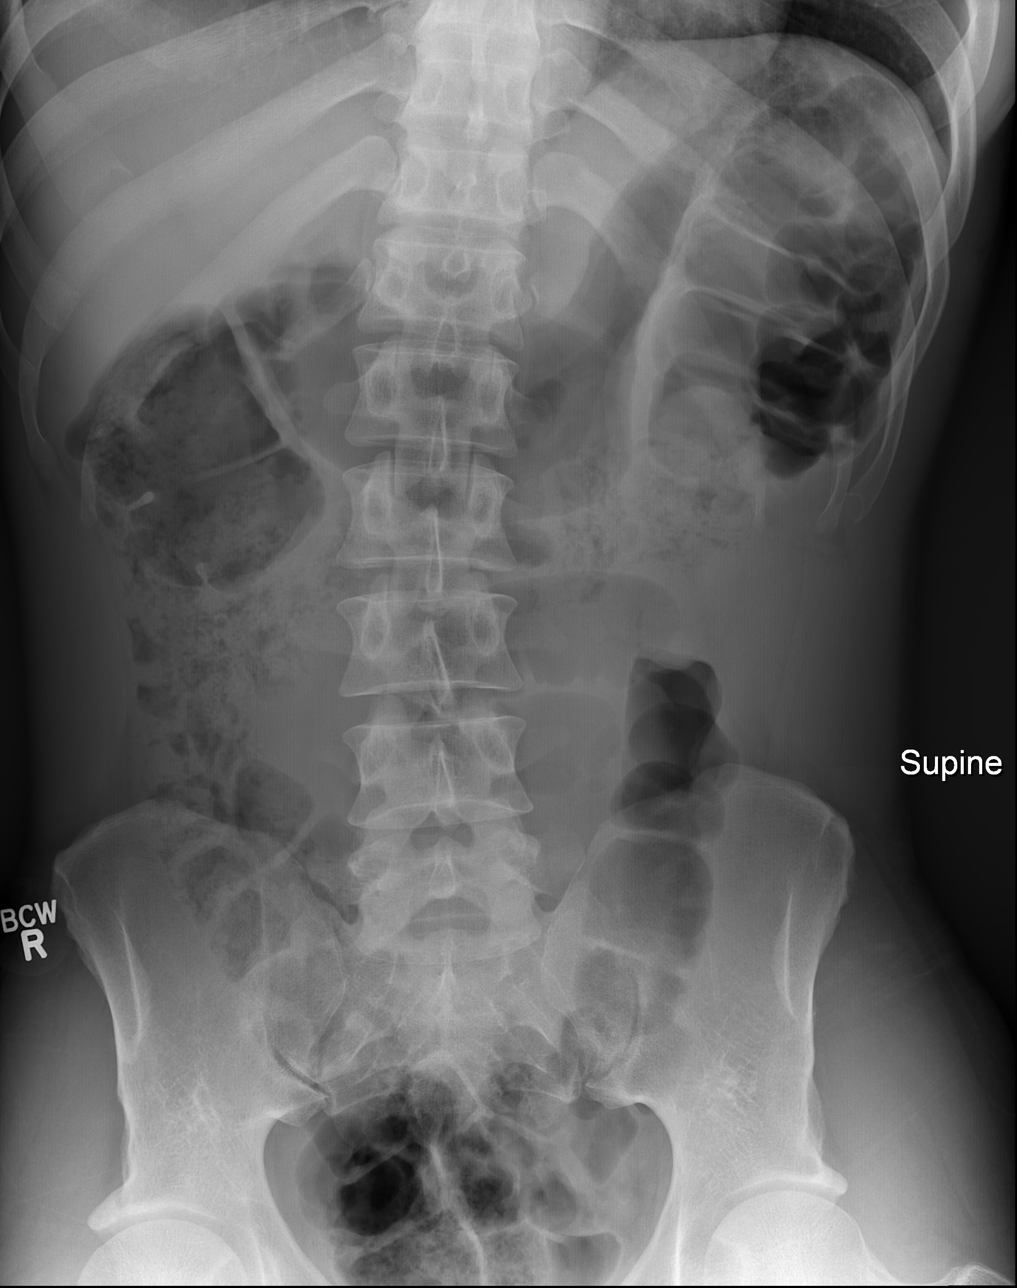

[w abdomen decub]
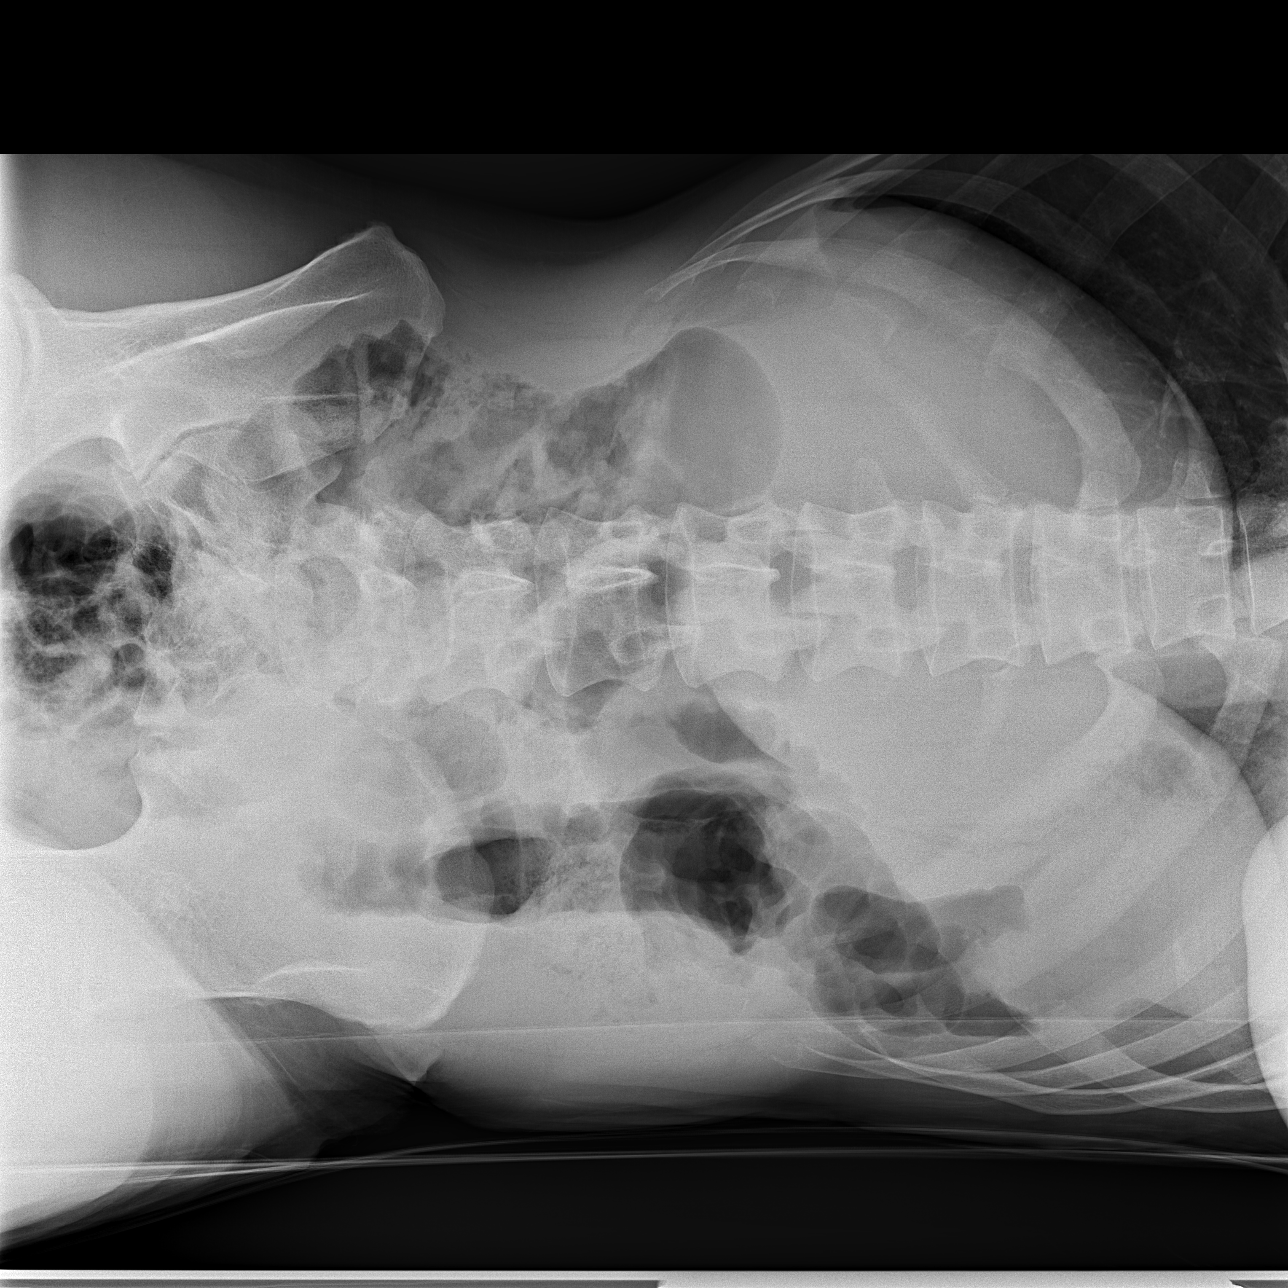

[3 of 3 positions shown; findings below may reference images not displayed]

FINDINGS: There is no evidence of dilated bowel loops or free intraperitoneal
air. No radiopaque calculi or other significant radiographic
abnormality is seen. Heart size and mediastinal contours are within
normal limits. Both lungs are clear.

Specifically, no radiopaque foreign bodies  are present.
IMPRESSION: Negative abdominal radiographs.  No acute cardiopulmonary disease.

## 2016-07-12 ENCOUNTER — Encounter (HOSPITAL_COMMUNITY): Payer: Self-pay | Admitting: Emergency Medicine

## 2016-07-12 ENCOUNTER — Emergency Department (HOSPITAL_COMMUNITY)
Admission: EM | Admit: 2016-07-12 | Discharge: 2016-07-13 | Disposition: A | Discharge status: Other Acute Inpatient Hospital | Payer: Federal, State, Local not specified - Other | Attending: Emergency Medicine | Admitting: Emergency Medicine

## 2016-07-12 DIAGNOSIS — F1920 Other psychoactive substance dependence, uncomplicated: Secondary | ICD-10-CM | POA: Diagnosis present

## 2016-07-12 DIAGNOSIS — Z79899 Other long term (current) drug therapy: Secondary | ICD-10-CM | POA: Insufficient documentation

## 2016-07-12 DIAGNOSIS — F250 Schizoaffective disorder, bipolar type: Secondary | ICD-10-CM | POA: Diagnosis present

## 2016-07-12 DIAGNOSIS — F17210 Nicotine dependence, cigarettes, uncomplicated: Secondary | ICD-10-CM | POA: Insufficient documentation

## 2016-07-12 LAB — COMPREHENSIVE METABOLIC PANEL
ALK PHOS: 28 U/L — AB (ref 38–126)
ALT: 18 U/L (ref 17–63)
AST: 24 U/L (ref 15–41)
Albumin: 4.3 g/dL (ref 3.5–5.0)
Anion gap: 9 (ref 5–15)
BILIRUBIN TOTAL: 0.4 mg/dL (ref 0.3–1.2)
BUN: 10 mg/dL (ref 6–20)
CO2: 25 mmol/L (ref 22–32)
CREATININE: 0.97 mg/dL (ref 0.61–1.24)
Calcium: 9.4 mg/dL (ref 8.9–10.3)
Chloride: 107 mmol/L (ref 101–111)
GFR calc Af Amer: >60 mL/min (ref >60)
Glucose, Bld: 96 mg/dL (ref 65–99)
Potassium: 4.2 mmol/L (ref 3.5–5.1)
Sodium: 141 mmol/L (ref 135–145)
TOTAL PROTEIN: 8.2 g/dL — AB (ref 6.5–8.1)

## 2016-07-12 LAB — CBC WITH DIFFERENTIAL/PLATELET
BASOS ABS: 0 10*3/uL (ref 0.0–0.1)
BASOS PCT: 0 %
EOS PCT: 1 %
Eosinophils Absolute: 0.1 10*3/uL (ref 0.0–0.7)
HCT: 40.4 % (ref 39.0–52.0)
Hemoglobin: 13.8 g/dL (ref 13.0–17.0)
Lymphocytes Relative: 26 %
Lymphs Abs: 2 10*3/uL (ref 0.7–4.0)
MCH: 28.2 pg (ref 26.0–34.0)
MCHC: 34.2 g/dL (ref 30.0–36.0)
MCV: 82.6 fL (ref 78.0–100.0)
Monocytes Absolute: 0.9 10*3/uL (ref 0.1–1.0)
Monocytes Relative: 11 %
Neutro Abs: 4.9 10*3/uL (ref 1.7–7.7)
Neutrophils Relative %: 62 %
PLATELETS: 302 10*3/uL (ref 150–400)
RBC: 4.89 MIL/uL (ref 4.22–5.81)
RDW: 14.8 % (ref 11.5–15.5)
WBC: 8 10*3/uL (ref 4.0–10.5)

## 2016-07-12 LAB — RAPID URINE DRUG SCREEN, HOSP PERFORMED
Amphetamines: NOT DETECTED
BENZODIAZEPINES: NOT DETECTED
Barbiturates: NOT DETECTED
Cocaine: POSITIVE — AB
Opiates: NOT DETECTED
Tetrahydrocannabinol: POSITIVE — AB

## 2016-07-12 LAB — ETHANOL: ALCOHOL ETHYL (B): 122 mg/dL — AB (ref <5)

## 2016-07-12 MED ORDER — TRAZODONE HCL 100 MG PO TABS
100.0000 mg | ORAL_TABLET | Freq: Every day | ORAL | Status: DC
  Administered 2016-07-12: 100 mg via ORAL
  Filled 2016-07-12: qty 1

## 2016-07-12 MED ORDER — ZIPRASIDONE MESYLATE 20 MG IM SOLR
INTRAMUSCULAR | Status: AC
  Filled 2016-07-12: qty 20

## 2016-07-12 MED ORDER — ZIPRASIDONE MESYLATE 20 MG IM SOLR
20.0000 mg | Freq: Once | INTRAMUSCULAR | Status: AC
  Administered 2016-07-12: 20 mg via INTRAMUSCULAR

## 2016-07-12 MED ORDER — STERILE WATER FOR INJECTION IJ SOLN
INTRAMUSCULAR | Status: AC
  Administered 2016-07-12: 06:00:00
  Filled 2016-07-12: qty 10

## 2016-07-12 MED ORDER — HYDROXYZINE HCL 25 MG PO TABS: 25.0000 mg | ORAL_TABLET | Freq: Four times a day (QID) | ORAL | Status: DC | PRN

## 2016-07-12 MED ORDER — RISPERIDONE 1 MG PO TABS
1.0000 mg | ORAL_TABLET | Freq: Two times a day (BID) | ORAL | Status: DC
  Administered 2016-07-12 – 2016-07-13 (×2): 1 mg via ORAL
  Filled 2016-07-12: qty 1

## 2016-07-12 MED ORDER — LORAZEPAM 2 MG/ML IJ SOLN
INTRAMUSCULAR | Status: AC
  Filled 2016-07-12: qty 1

## 2016-07-12 MED ORDER — AMANTADINE HCL 100 MG PO CAPS
100.0000 mg | ORAL_CAPSULE | Freq: Two times a day (BID) | ORAL | Status: DC
  Administered 2016-07-12 – 2016-07-13 (×2): 100 mg via ORAL
  Filled 2016-07-12 (×2): qty 1

## 2016-07-12 MED ORDER — RISPERIDONE 1 MG PO TABS
1.0000 mg | ORAL_TABLET | Freq: Two times a day (BID) | ORAL | Status: DC
  Filled 2016-07-12 (×2): qty 1

## 2016-07-12 MED ORDER — LORAZEPAM 2 MG/ML IJ SOLN
2.0000 mg | Freq: Once | INTRAMUSCULAR | Status: AC
  Administered 2016-07-12: 2 mg via INTRAMUSCULAR

## 2016-07-12 NOTE — ED Notes (Signed)
Pt sleeping at present, arouseable to verbal stimuli.  No distress noted, calm & cooperative.  Monitoring for safety, Q 15 min checks in effect.

## 2016-07-12 NOTE — BH Assessment (Signed)
[facility] Assessment Progress Note  Case was staffed with lord DNP who recommends a inpatient admission.

## 2016-07-12 NOTE — BH Assessment (Addendum)
Assessment Note  Garrett Arellano is an 41 y.o. male that presents this date under IVC. Per IVC: "Patient brought in by [employer] after demonstrating bizarre behavior and vandalizing. Patient is aggressive and states he is a demon that drinks blood. Patient states he will hang himself if let out of restraints." Patient is unable to be assessed by this Clinical research associatewriter and refuses to answer questions. Patient speaks incoherently at times and cannot be redirected. Information for purposes of assessment was obtained from admission notes. Admission note stated: "Patient brought in handcuffed via ankles and wrist due to violent behavior and verbal threatening. Patient is uncooperative with continuous yelling. Patient states "If I get out of these handcuffs everyone here is going to die because I am going to kill everyone and I will be strangling until I see blood. Because I drink blood I am the Dr. Rennis Pettyf Death. I want to kill myself too, if I can get to a cord I am going to hang myself or I am going to chew on a plug so I can electrocute me and everyone in here. We all gone blow up." He also verbalized "When I find a razor blade I am going to swallow it and cut my throat and I am going to do it because I have done it before." Patient continues to fidget in bed and yell out. [employer] and security remains at the room and doctor is at bedside. Emergent medications administered as ordered." Case was staffed with lord DNP who recommends a inpatient admission.   Diagnosis: Schizoaffective disorder, bipolar type (per notes)  Past Medical History:  Past Medical History:  Diagnosis Date  . Bipolar 1 disorder (HCC)   . Schizophrenia (HCC)   . Seizure [facility](HCC)     History reviewed. No pertinent surgical history.  Family History: History reviewed. No pertinent family history.  Social History:  reports that he has been smoking Cigarettes.  He has never used smokeless tobacco. He reports that he drinks alcohol. He reports that he does not use  drugs.  Additional Social History:  Alcohol / Drug Use Pain Medications: see PTA meds Prescriptions: see PTA meds (pt reports thorazine only) Over the Counter: see PTA meds History of alcohol / drug use?: Yes (UTA this date) Longest period of sobriety (when/how long): Unknown Negative Consequences of Use:  (UTA) Withdrawal Symptoms:  (UTA)  CIWA: CIWA-Ar BP: (!) 169/83 Pulse Rate: 87 COWS:    Allergies:  Allergies  Allergen Reactions  . Ibuprofen Swelling    Home Medications:  (Not in a hospital admission)  OB/GYN Status:  No LMP for male patient.  General Assessment Data Location of Assessment: [facility] TTS Assessment: In system Is this a Tele or Face-to-Face Assessment?: Face-to-Face Is this an Initial Assessment or a Re-assessment for this encounter?: Initial Assessment Marital status: Single (per previous notes) Maiden name: na Is patient pregnant?: No Pregnancy Status: No Living Arrangements: Other relatives (sister per notes) Can pt return to current living arrangement?:  (Unknown) Admission Status: Involuntary Is patient capable of signing voluntary admission?: No (Not on admission) Referral Source: Self/Family/Friend Insurance type: self pay  Medical Screening Exam [facility] Walk-in ONLY) Medical Exam completed: Yes  Crisis Care Plan Living Arrangements: Other relatives (sister per notes) Legal Guardian:  (na) [facility] of [facility]rist: [facility] (per notes) Name of Therapist: [facility] (per notes)  Education Status Is patient currently in school?: No Current Grade:  (na) Highest grade of school patient has completed:  (12) Name of school:  (na)  Contact person:  (na)  Risk to self with the past 6 months Suicidal Ideation: Yes-Currently Present (per IVC) Has patient been a risk to self within the past 6 months prior to admission? :  (UTA) Suicidal Intent: Yes-Currently Present Has patient had any suicidal intent within the past 6 months prior to admission? :   (UTA) Is patient at risk for suicide?: Yes Suicidal Plan?: Yes-Currently Present (Per IVC) Has patient had any suicidal plan within the past 6 months prior to admission? :  (UTA) Specify Current Suicidal Plan: per IVC hang himself Access to Means: No What has been your use of drugs/alcohol within the last 12 months?: Current use  (pt tested post cocaine this date) Previous Attempts/Gestures:  (UTA) How many times?:  (UTA) Other Self Harm Risks:  (UTA) Triggers for Past Attempts:  (UTA) Intentional Self Injurious Behavior:  (UTA) Family Suicide History:  (UTA) Recent stressful life event(s):  (UTA) Persecutory voices/beliefs?:  (UTA) Depression:  (UTA) Depression Symptoms:  (UTA) Substance abuse history and/or treatment for substance abuse?:  (UTA) Suicide prevention information given to non-admitted patients:  (UTA)  Risk to Others within the past 6 months Homicidal Ideation: Yes-Currently Present (per notes) Does patient have any lifetime risk of violence toward others beyond the six months prior to admission? :  (UTA) Thoughts of Harm to Others: Yes-Currently Present (per notes) Comment - Thoughts of Harm to Others: use a razor blade (per notes) Current Homicidal Intent: Yes-Currently Present (per notes) Current Homicidal Plan: Yes-Currently Present Describe Current Homicidal Plan: cut everyone with a razor (per notes) Access to Homicidal Means: No Identified Victim: Staff History of harm to others?:  (UTA) Assessment of Violence: On admission Violent Behavior Description: Threats attempts to assault staff Does patient have access to weapons?: No Criminal Charges Pending?:  (UTA) Does patient have a court date:  (UTA) Is patient on probation?:  (UTA)  Psychosis Hallucinations:  (UTA) Delusions:  (UTA)  Mental Status Report Appearance/Hygiene: In scrubs Eye Contact: Unable to Assess Motor Activity: Unable to assess Speech: Unable to assess Level of Consciousness:  Unable to assess Mood:  (UTA) Affect: Unable to Assess Anxiety Level:  (UTA) Thought Processes: Unable to Assess Judgement: Unable to Assess Orientation: Unable to assess Obsessive Compulsive Thoughts/Behaviors: Unable to Assess  Cognitive Functioning Concentration: Unable to Assess Memory: Unable to Assess IQ:  (UTA) Insight: Unable to Assess Impulse Control: Unable to Assess Appetite:  (UTA) Weight Loss:  (UTA) Weight Gain:  (UTA) Sleep:  (UTA) Total Hours of Sleep:  (UTA) Vegetative Symptoms:  (UTA)  ADLScreening [facility] Assessment Services) Patient's cognitive ability adequate to safely complete daily activities?: Yes (per notes) Patient able to express need for assistance with ADLs?: Yes Independently performs ADLs?: Yes (appropriate for developmental age) (per notes)  Prior Inpatient Therapy Prior Inpatient Therapy: Yes Prior Therapy Dates: 2017 Prior Therapy Facilty/Provider(s): [facility] Reason for Treatment: MH issues  Prior Outpatient Therapy Prior Outpatient Therapy: Yes (per notes) Prior Therapy Dates: 2018 Prior Therapy Facilty/Provider(s): [facility] Reason for Treatment: MH issues Does patient have an ACCT team?: Unknown Does patient have Intensive In-House Services?  : Unknown Does patient have [facility] services? : Yes Does patient have P4CC services?: Unknown  ADL Screening (condition at time of admission) Patient's cognitive ability adequate to safely complete daily activities?: Yes (per notes) Is the patient deaf or have difficulty hearing?: No Does the patient have difficulty seeing, even when wearing glasses/contacts?: No Does the patient have difficulty concentrating, remembering, or making decisions?: Yes (on admission)  Patient able to express need for assistance with ADLs?: Yes Does the patient have difficulty dressing or bathing?: No (per notes) Independently performs ADLs?: Yes (appropriate for developmental age) (per notes) Does the patient have  difficulty walking or climbing stairs?: No Weakness of Legs: None Weakness of Arms/Hands: None  Home Assistive Devices/Equipment Home Assistive Devices/Equipment: None  Therapy Consults (therapy consults require a physician order) PT Evaluation Needed: No OT Evalulation Needed: No SLP Evaluation Needed: No Abuse/Neglect Assessment (Assessment to be complete while patient is alone) Physical Abuse: Denies (per previous notes) Verbal Abuse: Denies (per previous notes) Sexual Abuse: Denies (per previous notes) Exploitation of patient/patient's resources: Denies (per previous notes) Self-Neglect: Denies (per previous notes) Values / Beliefs Cultural Requests During Hospitalization: None (per previous notes) Spiritual Requests During Hospitalization: None (per previous notes) Consults Spiritual Care Consult Needed: No Social Work Consult Needed: No Merchant navy officer (For Healthcare) Does Patient Have a Medical Advance Directive?: No Would patient like information on creating a medical advance directive?: No - Patient declined    Additional Information 1:1 In Past 12 Months?: No CIRT Risk: Yes Elopement Risk: Yes Does patient have medical clearance?: Yes     Disposition:  Case was staffed with lord DNP who recommends a inpatient admission.   Disposition Initial Assessment Completed for this Encounter: Yes Disposition of Patient: Inpatient treatment program Type of inpatient treatment program: Adult  On Site Evaluation by:   Reviewed with Physician:    Alfredia Ferguson 07/12/2016 5:54 PM

## 2016-07-12 NOTE — ED Notes (Signed)
Woke up pt and asked if they could provide a urine sample but pt stated they are unable to do so at this time

## 2016-07-12 NOTE — ED Notes (Signed)
Patient brought in handcuffed via ankles and wrist due to violent behavior and verbal threatening. Patient is uncooperative with continuous yelling. Patient states "If I get out of these handcuffs everyone here is going to die because I am going to kill everyone and I will be strangling until I see blood. Because I drink blood I am the Dr. Rennis Pettyf Death. I want to kill myself too, if I can get to a cord I am going to hang myself or I am going to chew on a plug so I can electrocute me and everyone in here. We all gone blow up." He also verbalized "When I find a razor blade I am going to swallow it and cut my throat and I am going to do it because I have done it before." Patient continues to fidget in bed and yell out. [employer] and security remains at the room and doctor is at bedside. Emergent medications administered as ordered.

## 2016-07-12 NOTE — ED Triage Notes (Signed)
Pt presents from [employer] for evaluation of SI and aggression under IVC. Pt was found by [employer] throwing trash cans and cinder blocks in roadway. Pt voiced to [employer] that he would kill himself after he was placed into handcuffs. Pt stating that he is "Dr.Death" and will choke anyone that gets in his reach. [employer] at bedside with pt at this time.

## 2016-07-12 NOTE — ED Provider Notes (Signed)
[facility]ENCY DEPT Provider Note   CSN: [id number] Arrival date & time: 07/12/16  0601     History   Chief Complaint Chief Complaint  Patient presents with  . IVC  . Suicidal    HPI Garrett Arellano is a 41 y.o. male.  HPI Patient presents to the emergency room for evaluation of bizarre behavior. Patient was brought in by police. They were called out 2 different gas stations tonight where the patient was demonstrating bizarre, agitated behavior. Patient moves and from those facilities. Patient was eventually picked up another gas station.  Along the way he was vandalizing and damaging property.  Pt was brought in hand cuffed and hog tied because of his aggressive behavior.  Pt states he is a Ambulance person.  He drinks blood.  He is suicidal and will hang himself if we let him go.  Past Medical History:  Diagnosis Date  . Bipolar 1 disorder (HCC)   . Schizophrenia (HCC)   . Seizure [facility])     Patient Active Problem List   Diagnosis Date Noted  . Alcohol abuse 04/26/2016  . 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 Schizoaffective disorder, bipolar type (HCC) 04/25/2016  . Polysubstance abuse 10/14/2014  . Acute psychosis   . Suicidal ideation 10/09/2014    History reviewed. No pertinent surgical history.     Home Medications    Prior to Admission medications   Medication Sig Start Date End Date Taking? Authorizing Provider  amantadine (SYMMETREL) 100 MG capsule Take 1 capsule (100 mg total) by mouth 2 (two) times daily. 04/26/16   Charm Rings, NP  hydrOXYzine (ATARAX/VISTARIL) 25 MG tablet Take 1 tablet (25 mg total) by mouth every 6 (six) hours as needed (anxiety/agitation or CIWA < or = 10). 04/26/16   Charm Rings, NP  risperiDONE (RISPERDAL) 1 MG tablet Take 1 tablet (1 mg total) by mouth 2 (two) times daily. 04/26/16   Charm Rings, NP  risperiDONE (RISPERDAL) 2 MG tablet Take 0.5 tablets (1 mg total) by mouth 2 (two) times  daily. 10/10/14   Earney Navy, NP  traZODone (DESYREL) 100 MG tablet Take 1 tablet (100 mg total) by mouth at bedtime. 04/26/16   Charm Rings, NP    Family History History reviewed. No pertinent family history.  Social History Social History  Substance Use Topics  . Smoking status: Current Every Day Smoker    Types: Cigarettes  . Smokeless tobacco: Never Used  . Alcohol use Yes     Allergies   Ibuprofen   Review of Systems Review of Systems  Unable to perform ROS: Psychiatric disorder     Physical Exam Updated Vital Signs Ht 5\' 10"  (1.778 m)   Wt 86.2 kg   BMI 27.26 kg/m   Physical Exam  Constitutional: He appears well-developed and well-nourished.  HENT:  Head: Normocephalic and atraumatic.  Right Ear: External ear normal.  Left Ear: External ear normal.  Eyes: Conjunctivae are normal. Right eye exhibits no discharge. Left eye exhibits no discharge. No scleral icterus.  Neck: Neck supple. No tracheal deviation present.  Cardiovascular: Normal rate.   Pulmonary/Chest: Effort normal. No stridor. No respiratory distress.  Abdominal: He exhibits no distension.  Musculoskeletal: He exhibits no edema.  Neurological: He is alert. Cranial nerve deficit: no gross deficits.  Skin: Skin is warm and dry. No rash noted.  Psychiatric: His affect is angry and labile. His speech is tangential. He is agitated and aggressive. Thought  content is delusional. He expresses impulsivity and inappropriate judgment. He expresses suicidal ideation.  Nursing note and vitals reviewed.    ED Treatments / Results  Labs (all labs ordered are listed, but only abnormal results are displayed) Labs Reviewed  COMPREHENSIVE METABOLIC PANEL  ETHANOL  CBC WITH DIFFERENTIAL/PLATELET  RAPID URINE DRUG SCREEN, HOSP PERFORMED    Radiology No results found.  Procedures Procedures (including critical care time)  Medications Ordered in ED Medications  ziprasidone (GEODON) injection 20  mg (20 mg Intramuscular Given 07/12/16 0619)  LORazepam (ATIVAN) injection 2 mg (2 mg Intramuscular Given 07/12/16 0619)  sterile water (preservative free) injection (  Given 07/12/16 0620)     Initial Impression / Assessment and Plan / ED Course  I have reviewed the triage vital signs and the nursing notes.  Pertinent labs & imaging results that were available during my care of the patient were reviewed by me and considered in my medical decision making (see chart for details).  Clinical Course as of Jul 12 645  Mon Jul 12, 2016  [id number] Previous records reviewed.  Pt seen in the past for similar symptoms.  Hx of schizophrenia and substance abuse.  [JK]  [id number] Sedation meds ordered.  Will order restraints and attempt to remove cuffs.  [JK]  [id number] Will check for acute medical complications.  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

## 2016-07-12 NOTE — ED Notes (Signed)
ATTEMPTED TO GIVE REPORT, NURSE STS SHE WILL CALL ME BACK.

## 2016-07-12 NOTE — ED Notes (Signed)
When patient was approached writer noticed he was sweating, and his sheets were soaked. Sheets were changed and patient was given something to drink and was instructed to not cover himself up with a lot of blankets. He had gotten up walked fine and was coherent and responding to everything fine. Patient used restroom asked for snack went back in his room ate his snack and went back to sleep

## 2016-07-12 NOTE — Progress Notes (Signed)
Pt ambulatory to [facility] with a steady gait. A & O to self, place and event. Denies SI, HI, AVH and pain when assessed. Presents grandiose, tangential and concrete during conversation "I'm here because the police messing with me for what I have, they messed with my belongings too so I kicked their window". Per report, [facility] for bizarre behavior at a gas station, aggression and vandalizing property, stated he is a demon that drinks blood. Cooperative with assessment. Tolerated all PO intake well. Unit routines discussed with pt, understanding verbalized "ok, where my babies at". Q 15 minutes safety checks maintained without self harm gestures.

## 2016-07-13 ENCOUNTER — Encounter (HOSPITAL_COMMUNITY): Payer: Self-pay | Admitting: *Deleted

## 2016-07-13 ENCOUNTER — Inpatient Hospital Stay (HOSPITAL_COMMUNITY)
Admission: AD | Admit: 2016-07-13 | Discharge: 2016-07-20 | DRG: 885 | Disposition: A | Discharge status: Home / Self Care | Payer: Federal, State, Local not specified - Other | Attending: Psychiatry | Admitting: Psychiatry

## 2016-07-13 DIAGNOSIS — Z79899 Other long term (current) drug therapy: Secondary | ICD-10-CM

## 2016-07-13 DIAGNOSIS — G40909 Epilepsy, unspecified, not intractable, without status epilepticus: Secondary | ICD-10-CM | POA: Diagnosis present

## 2016-07-13 DIAGNOSIS — Z888 Allergy status to other drugs, medicaments and biological substances status: Secondary | ICD-10-CM | POA: Diagnosis not present

## 2016-07-13 DIAGNOSIS — F250 Schizoaffective disorder, bipolar type: Secondary | ICD-10-CM

## 2016-07-13 DIAGNOSIS — F1920 Other psychoactive substance dependence, uncomplicated: Secondary | ICD-10-CM | POA: Diagnosis not present

## 2016-07-13 DIAGNOSIS — F19980 Other psychoactive substance use, unspecified with psychoactive substance-induced anxiety disorder: Secondary | ICD-10-CM | POA: Diagnosis present

## 2016-07-13 DIAGNOSIS — F1020 Alcohol dependence, uncomplicated: Secondary | ICD-10-CM | POA: Diagnosis present

## 2016-07-13 DIAGNOSIS — F17210 Nicotine dependence, cigarettes, uncomplicated: Secondary | ICD-10-CM

## 2016-07-13 DIAGNOSIS — Z880 Allergy status to penicillin: Secondary | ICD-10-CM | POA: Diagnosis not present

## 2016-07-13 DIAGNOSIS — F1420 Cocaine dependence, uncomplicated: Secondary | ICD-10-CM | POA: Diagnosis not present

## 2016-07-13 DIAGNOSIS — R45850 Homicidal ideations: Secondary | ICD-10-CM

## 2016-07-13 DIAGNOSIS — F1220 Cannabis dependence, uncomplicated: Secondary | ICD-10-CM | POA: Diagnosis not present

## 2016-07-13 DIAGNOSIS — F19922 Other psychoactive substance use, unspecified with intoxication with perceptual disturbance: Secondary | ICD-10-CM

## 2016-07-13 MED ORDER — LORAZEPAM 1 MG PO TABS: 1.0000 mg | ORAL_TABLET | Freq: Two times a day (BID) | ORAL | Status: DC

## 2016-07-13 MED ORDER — CLONIDINE HCL 0.1 MG PO TABS: 0.1000 mg | ORAL_TABLET | Freq: Every day | ORAL | Status: DC

## 2016-07-13 MED ORDER — AMANTADINE HCL 100 MG PO CAPS
100.0000 mg | ORAL_CAPSULE | Freq: Two times a day (BID) | ORAL | Status: DC
  Administered 2016-07-13 – 2016-07-20 (×12): 100 mg via ORAL
  Filled 2016-07-13 (×17): qty 1

## 2016-07-13 MED ORDER — RISPERIDONE 2 MG PO TABS: 2.0000 mg | ORAL_TABLET | Freq: Two times a day (BID) | ORAL | Status: DC

## 2016-07-13 MED ORDER — ACETAMINOPHEN 325 MG PO TABS: 650.0000 mg | ORAL_TABLET | Freq: Four times a day (QID) | ORAL | Status: DC | PRN

## 2016-07-13 MED ORDER — AMANTADINE HCL 100 MG PO CAPS
100.0000 mg | ORAL_CAPSULE | Freq: Two times a day (BID) | ORAL | Status: DC
  Filled 2016-07-13 (×2): qty 1

## 2016-07-13 MED ORDER — CLONIDINE HCL 0.1 MG PO TABS
0.1000 mg | ORAL_TABLET | Freq: Four times a day (QID) | ORAL | Status: DC
  Administered 2016-07-13: 0.1 mg via ORAL
  Filled 2016-07-13 (×11): qty 1

## 2016-07-13 MED ORDER — HYDROXYZINE HCL 25 MG PO TABS: 25.0000 mg | ORAL_TABLET | Freq: Three times a day (TID) | ORAL | Status: DC

## 2016-07-13 MED ORDER — RISPERIDONE 2 MG PO TABS
2.0000 mg | ORAL_TABLET | Freq: Two times a day (BID) | ORAL | Status: DC
  Administered 2016-07-13: 2 mg via ORAL
  Filled 2016-07-13 (×5): qty 1

## 2016-07-13 MED ORDER — CLONIDINE HCL 0.1 MG PO TABS
0.1000 mg | ORAL_TABLET | ORAL | Status: DC
  Filled 2016-07-13 (×2): qty 1

## 2016-07-13 MED ORDER — VITAMIN B-1 100 MG PO TABS
100.0000 mg | ORAL_TABLET | Freq: Every day | ORAL | Status: DC
  Administered 2016-07-14 – 2016-07-20 (×6): 100 mg via ORAL
  Filled 2016-07-13 (×8): qty 1

## 2016-07-13 MED ORDER — DICYCLOMINE HCL 20 MG PO TABS: 20.0000 mg | ORAL_TABLET | Freq: Four times a day (QID) | ORAL | Status: DC | PRN

## 2016-07-13 MED ORDER — CHLORPROMAZINE HCL 25 MG/ML IJ SOLN
50.0000 mg | Freq: Once | INTRAMUSCULAR | Status: DC
  Filled 2016-07-13: qty 2

## 2016-07-13 MED ORDER — HYDROXYZINE HCL 25 MG PO TABS: 25.0000 mg | ORAL_TABLET | Freq: Four times a day (QID) | ORAL | Status: DC | PRN

## 2016-07-13 MED ORDER — AMANTADINE HCL 100 MG PO CAPS: 100.0000 mg | ORAL_CAPSULE | Freq: Two times a day (BID) | ORAL | Status: DC

## 2016-07-13 MED ORDER — ONDANSETRON 4 MG PO TBDP: 4.0000 mg | ORAL_TABLET | Freq: Four times a day (QID) | ORAL | Status: AC | PRN

## 2016-07-13 MED ORDER — OLANZAPINE 10 MG PO TBDP: 10.0000 mg | ORAL_TABLET | Freq: Three times a day (TID) | ORAL | Status: DC | PRN

## 2016-07-13 MED ORDER — GUAIFENESIN ER 600 MG PO TB12
600.0000 mg | ORAL_TABLET | Freq: Two times a day (BID) | ORAL | Status: DC
  Administered 2016-07-13 – 2016-07-20 (×13): 600 mg via ORAL
  Filled 2016-07-13 (×17): qty 1

## 2016-07-13 MED ORDER — ALUM & MAG HYDROXIDE-SIMETH 200-200-20 MG/5ML PO SUSP: 30.0000 mL | ORAL | Status: DC | PRN

## 2016-07-13 MED ORDER — GABAPENTIN 300 MG PO CAPS
300.0000 mg | ORAL_CAPSULE | Freq: Three times a day (TID) | ORAL | Status: DC
  Administered 2016-07-14 (×3): 300 mg via ORAL
  Filled 2016-07-13 (×11): qty 1

## 2016-07-13 MED ORDER — LORAZEPAM 1 MG PO TABS
1.0000 mg | ORAL_TABLET | Freq: Four times a day (QID) | ORAL | Status: DC
  Administered 2016-07-13 – 2016-07-14 (×2): 1 mg via ORAL
  Filled 2016-07-13 (×3): qty 1

## 2016-07-13 MED ORDER — LORAZEPAM 1 MG PO TABS: 1.0000 mg | ORAL_TABLET | Freq: Four times a day (QID) | ORAL | Status: AC | PRN

## 2016-07-13 MED ORDER — CHLORPROMAZINE HCL 50 MG PO TABS
50.0000 mg | ORAL_TABLET | Freq: Once | ORAL | Status: AC
  Administered 2016-07-13: 50 mg via ORAL
  Filled 2016-07-13: qty 1
  Filled 2016-07-13: qty 2

## 2016-07-13 MED ORDER — BENZONATATE 100 MG PO CAPS
200.0000 mg | ORAL_CAPSULE | Freq: Three times a day (TID) | ORAL | Status: DC | PRN
  Administered 2016-07-14 – 2016-07-19 (×4): 200 mg via ORAL
  Filled 2016-07-13 (×4): qty 2

## 2016-07-13 MED ORDER — METHOCARBAMOL 500 MG PO TABS: 500.0000 mg | ORAL_TABLET | Freq: Three times a day (TID) | ORAL | Status: AC | PRN

## 2016-07-13 MED ORDER — HYDROXYZINE HCL 25 MG PO TABS
25.0000 mg | ORAL_TABLET | Freq: Three times a day (TID) | ORAL | Status: DC
  Filled 2016-07-13 (×5): qty 1

## 2016-07-13 MED ORDER — GABAPENTIN 300 MG PO CAPS: 300.0000 mg | ORAL_CAPSULE | Freq: Three times a day (TID) | ORAL | Status: DC

## 2016-07-13 MED ORDER — LORAZEPAM 1 MG PO TABS: 1.0000 mg | ORAL_TABLET | Freq: Three times a day (TID) | ORAL | Status: DC

## 2016-07-13 MED ORDER — THIAMINE HCL 100 MG/ML IJ SOLN
100.0000 mg | Freq: Once | INTRAMUSCULAR | Status: DC
  Filled 2016-07-13: qty 2

## 2016-07-13 MED ORDER — TRAZODONE HCL 100 MG PO TABS: 100.0000 mg | ORAL_TABLET | Freq: Every day | ORAL | Status: DC

## 2016-07-13 MED ORDER — LOPERAMIDE HCL 2 MG PO CAPS: 2.0000 mg | ORAL_CAPSULE | ORAL | Status: DC | PRN

## 2016-07-13 MED ORDER — RISPERIDONE 1 MG PO TABS
1.0000 mg | ORAL_TABLET | Freq: Two times a day (BID) | ORAL | Status: DC
  Filled 2016-07-13 (×2): qty 1

## 2016-07-13 MED ORDER — MAGNESIUM HYDROXIDE 400 MG/5ML PO SUSP: 30.0000 mL | Freq: Every day | ORAL | Status: DC | PRN

## 2016-07-13 MED ORDER — TRAZODONE HCL 100 MG PO TABS
100.0000 mg | ORAL_TABLET | Freq: Every day | ORAL | Status: DC
  Administered 2016-07-13: 100 mg via ORAL
  Filled 2016-07-13 (×2): qty 1

## 2016-07-13 MED ORDER — ADULT MULTIVITAMIN W/MINERALS CH
1.0000 | ORAL_TABLET | Freq: Every day | ORAL | Status: DC
  Administered 2016-07-14 – 2016-07-20 (×6): 1 via ORAL
  Filled 2016-07-13 (×9): qty 1

## 2016-07-13 MED ORDER — LORAZEPAM 1 MG PO TABS: 1.0000 mg | ORAL_TABLET | Freq: Every day | ORAL | Status: DC

## 2016-07-13 NOTE — Progress Notes (Signed)
Around 8:35 pm  I was getting vitals on the patient Garrett Arellano([name]) in the day room and heard a band as if someone had slammed a door. I ran out into the hallway and seen the patient Garrett Arellano in the hallway and seen he door that is located midway of the hallway was shattered. I ask patient did he do that and he said yes I want my meds. I explained to the patient that he had not long been admitted and it takes a little time for the doctor to put in orders and for the  pharmacist to verify them. I explained simply talking with a staff memeber and Letting us know when he feels he is getting angry or anxious would help us in getting him medication. I notified the [facility] , charge nurse [facility], and the [employer] [facility] as well as  Arlys JohnBrian was called and he came in to repair the window.

## 2016-07-13 NOTE — Progress Notes (Signed)
[facility] Pt reported this is his first admission.  Pt stated he did not know why he is here.  He stated someone told him he tried to kick the windows out of a cop's car but he does not remember.  When asked if he had thoughts of hurting himself he stated yes. When asked about a stressor he stated "There is two of me."  "I am a ghost".  When asked if he had a plan he looked towards the window and asked what the window was made of.  He also stated that God brought him here to work on a bridge.  Pt reports hx of seizures.  He stated he has them in his sleep but denied recent seizure.  Pt reports that he does at times experience audio hallucinations but denies today.  Denied visual hallucinations.  Fifteen minute checks initiated for patient safety.  Pt safe on unit.

## 2016-07-13 NOTE — [facility] Group Notes (Signed)
Pt attended group and writer explained the rules of the unit. Pt was asked how his day was on a scale of 1-10? Pt stated his day was a 1.  Writer asked why was it a 1? Pt stated "I'm too far away from the fireplace to smell the order of the fireplace and I want to be close enough to roast marshmellows. Pt was asked if he could change on thing about his life what would it be? Pt stated want to be ably to fly (spirituality). Like being on a trampoline each time you jump you go higher and higher but as you go up you come down less.

## 2016-07-13 NOTE — Progress Notes (Signed)
07/13/16 1400:  LRT introduced self to pt and offered activities.  Pt was social and engaged during activities.  Pt played [place] and [employer] with pt and a peer.  Pt was bright throughout activity.  Caroll RancherMarjette Oyinkansola Truax, LRT/CTRS
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
## 2016-07-13 NOTE — ED Notes (Signed)
Pt discharged ambulatory with [employer].  Pt was calm and cooperative at discharge.  All belongings were sent with patient.

## 2016-07-13 NOTE — BH Assessment (Addendum)
[facility] Assessment Progress Note  Per Thedore MinsMojeed Akintayo, MD, this pt requires psychiatric hospitalization.  Berneice Heinrichina Tate, RN, [id number] they will be ready to receive pt at 18:00.  Pt presents under IVC, and [facility]  Pt's nurse, Kendal Hymendie, has been notified, and agrees to call report to [phone].  Pt is to be transported via [employer].   Doylene Canninghomas Ames Hoban, MA Triage Specialist [phone]

## 2016-07-14 ENCOUNTER — Encounter (HOSPITAL_COMMUNITY): Payer: Self-pay | Admitting: Psychiatry

## 2016-07-14 DIAGNOSIS — F1020 Alcohol dependence, uncomplicated: Secondary | ICD-10-CM | POA: Diagnosis present

## 2016-07-14 DIAGNOSIS — F1220 Cannabis dependence, uncomplicated: Secondary | ICD-10-CM | POA: Diagnosis present

## 2016-07-14 DIAGNOSIS — F19922 Other psychoactive substance use, unspecified with intoxication with perceptual disturbance: Secondary | ICD-10-CM

## 2016-07-14 DIAGNOSIS — F1420 Cocaine dependence, uncomplicated: Secondary | ICD-10-CM | POA: Diagnosis present

## 2016-07-14 DIAGNOSIS — F19980 Other psychoactive substance use, unspecified with psychoactive substance-induced anxiety disorder: Secondary | ICD-10-CM | POA: Clinically undetermined

## 2016-07-14 MED ORDER — TRAZODONE HCL 50 MG PO TABS
50.0000 mg | ORAL_TABLET | Freq: Every day | ORAL | Status: DC
Start: 2016-07-14 — End: 2016-07-20
  Administered 2016-07-14 – 2016-07-19 (×6): 50 mg via ORAL
  Filled 2016-07-14 (×10): qty 1

## 2016-07-14 MED ORDER — BENZTROPINE MESYLATE 1 MG PO TABS
1.0000 mg | ORAL_TABLET | Freq: Four times a day (QID) | ORAL | Status: DC | PRN
Start: 2016-07-14 — End: 2016-07-19

## 2016-07-14 MED ORDER — DIPHENHYDRAMINE HCL 25 MG PO CAPS
50.0000 mg | ORAL_CAPSULE | Freq: Four times a day (QID) | ORAL | Status: DC | PRN
  Administered 2016-07-14: 50 mg via ORAL
  Filled 2016-07-14 (×3): qty 2

## 2016-07-14 MED ORDER — CLOTRIMAZOLE 1 % EX CREA
TOPICAL_CREAM | Freq: Two times a day (BID) | CUTANEOUS | Status: DC
  Administered 2016-07-14 – 2016-07-20 (×7): via TOPICAL
  Filled 2016-07-14 (×2): qty 15

## 2016-07-14 MED ORDER — BENZTROPINE MESYLATE 1 MG/ML IJ SOLN: 1.0000 mg | Freq: Four times a day (QID) | INTRAMUSCULAR | Status: DC | PRN

## 2016-07-14 MED ORDER — OLANZAPINE 5 MG PO TABS
5.0000 mg | ORAL_TABLET | Freq: Every day | ORAL | Status: DC
  Administered 2016-07-14: 5 mg via ORAL
  Filled 2016-07-14: qty 2
  Filled 2016-07-14 (×3): qty 1

## 2016-07-14 MED ORDER — HALOPERIDOL 5 MG PO TABS
5.0000 mg | ORAL_TABLET | Freq: Three times a day (TID) | ORAL | Status: DC | PRN
  Administered 2016-07-16 – 2016-07-17 (×2): 5 mg via ORAL
  Filled 2016-07-14 (×2): qty 1

## 2016-07-14 MED ORDER — CARBAMAZEPINE ER 200 MG PO TB12
200.0000 mg | ORAL_TABLET | Freq: Two times a day (BID) | ORAL | Status: DC
  Administered 2016-07-14 – 2016-07-20 (×11): 200 mg via ORAL
  Filled 2016-07-14 (×16): qty 1

## 2016-07-14 MED ORDER — OLANZAPINE 5 MG PO TABS
5.0000 mg | ORAL_TABLET | Freq: Every day | ORAL | Status: DC
  Administered 2016-07-14: 5 mg via ORAL
  Filled 2016-07-14 (×4): qty 1

## 2016-07-14 MED ORDER — HALOPERIDOL LACTATE 5 MG/ML IJ SOLN: 5.0000 mg | Freq: Three times a day (TID) | INTRAMUSCULAR | Status: DC | PRN

## 2016-07-14 MED ORDER — LEVETIRACETAM 500 MG PO TABS
500.0000 mg | ORAL_TABLET | Freq: Two times a day (BID) | ORAL | Status: DC
  Filled 2016-07-14 (×3): qty 1

## 2016-07-14 NOTE — Progress Notes (Signed)
Patient ID: Garrett Arellano, male   DOB: 03/14/1976, 41 y.o.   MRN: [id number]  D: Patient in room talking to another pt on approach. Pt stated what medication he is willing to take. Pt refuse clonidine stating he is not withdrawing. Pt does not appear to be in withdrawal. Pt requested cream for his itchy feet. Pt denies  SI/HI/AVH and pain. A: Support and encouragement offered as needed. Medications administered as prescribed.  R: Patient safe on the unit. Will continue to monitor patient for safety and stability.

## 2016-07-14 NOTE — [facility] Group Notes (Signed)
[facility] Group Notes:  (Counselor/Nursing/MHT/Case Management/Adjunct)  07/14/2016 1:15PM  Type of Therapy:  Group Therapy  Participation Level:  Active  Participation Quality:  Appropriate  Affect:  Flat  Cognitive:  Oriented  Insight:  Improving  Engagement in Group:  Limited  Engagement in Therapy:  Limited  Modes of Intervention:  Discussion, Exploration and Socialization  Summary of Progress/Problems: The topic for group was balance in life.  Pt participated in the discussion about when their life was in balance and out of balance and how this feels.  Pt discussed ways to get back in balance and short term goals they can work on to get where they want to be. Invited.  Chose to not attend.   Daryel Geraldorth, Ipek Westra B 07/14/2016 2:13 PM

## 2016-07-14 NOTE — [facility] Suicide Risk Assessment (Signed)
[facility]   Nursing information obtained from:    Demographic factors:  Male, Low socioeconomic status Current Mental Status:  Suicidal ideation indicated by patient Loss Factors:  NA Historical Factors:  Prior suicide attempts, Family history of mental illness or substance abuse Risk Reduction Factors:  Sense of responsibility to family  Total Time spent with patient: 20 minutes Principal Problem: Schizoaffective disorder, bipolar type (HCC) Diagnosis:   Patient Active Problem List   Diagnosis Date Noted  . Alcohol use disorder, severe, dependence (HCC) [F10.20] 07/14/2016  . Cocaine use disorder, severe, dependence (HCC) [F14.20] 07/14/2016  . Cannabis use disorder, severe, dependence (HCC) [F12.20] 07/14/2016  . 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 Alcohol abuse [F10.10] 04/26/2016  . 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 Schizoaffective disorder, bipolar type (HCC) [F25.0] 04/25/2016  . Polysubstance abuse [F19.10] 10/14/2014  . Acute psychosis [F23]   . Suicidal ideation [R45.851] 10/09/2014   Subjective Data: Please see H&P.   Continued Clinical Symptoms:    The "Alcohol Use Disorders Identification Test", Guidelines for Use in Primary Care, Second Edition.  World Science writerHealth Organization [facility](WHO). Score between 0-7:  no or low risk or alcohol related problems. Score between 8-15:  moderate risk of alcohol related problems. Score between 16-19:  high risk of alcohol related problems. Score 20 or above:  warrants further diagnostic evaluation for alcohol dependence and treatment.   CLINICAL FACTORS:   Alcohol/Substance Abuse/Dependencies Unstable or Poor Therapeutic Relationship Previous Psychiatric Diagnoses and Treatments   Musculoskeletal: Strength & Muscle Tone: within normal limits Gait & Station:  normal Patient leans: N/A  Psychiatric Specialty Exam: Physical Exam  ROS  Blood pressure (!) 137/99, pulse (!) 112, temperature 97.8 F (36.6 C), temperature source Oral, resp. rate 16, SpO2 100 %.There is no height or weight on file to calculate BMI.                Please see H&P.                                           COGNITIVE FEATURES THAT CONTRIBUTE TO RISK:  Closed-mindedness, Polarized thinking and Thought constriction (tunnel vision)    SUICIDE RISK:   Moderate:  Frequent suicidal ideation with limited intensity, and duration, some specificity in terms of plans, no associated intent, good self-control, limited dysphoria/symptomatology, some risk factors present, and identifiable protective factors, including available and accessible social support.  PLAN OF CARE: Please see H&P.   I certify that inpatient services furnished can reasonably be expected to improve the patient's condition.   Carmela Piechowski, MD 07/14/2016, 9:56 AM

## 2016-07-14 NOTE — Tx Team (Signed)
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 acetaminophen (TYLENOL) tablet 650 mg  650 mg Oral Q6H PRN Patrecia Pour, NP      . alum & mag hydroxide-simeth (MAALOX/MYLANTA) 200-200-20 MG/5ML suspension 30 mL  30 mL Oral Q4H PRN Patrecia Pour, NP      . amantadine (SYMMETREL) capsule 100 mg  100 mg Oral BID Ursula Alert, MD   100 mg at 07/14/16 0954  . benzonatate (TESSALON) capsule 200 mg  200 mg Oral TID PRN Laverle Hobby, PA-C      . benztropine (COGENTIN) tablet 1 mg  1 mg Oral Q6H PRN Ursula Alert, MD       Or  . benztropine mesylate (COGENTIN) injection 1 mg  1 mg Intramuscular Q6H PRN Ursula Alert, MD      . carbamazepine (TEGRETOL XR) 12 hr tablet 200 mg  200 mg Oral BID Ursula Alert, MD   200 mg at 07/14/16 1147  . cloNIDine (CATAPRES) tablet 0.1 mg  0.1 mg Oral QID Laverle Hobby, PA-C   0.1 mg at 07/13/16 2200   Followed by  . [START ON 07/16/2016] cloNIDine (CATAPRES) tablet 0.1 mg  0.1 mg Oral BH-qamhs Spencer E Simon, PA-C       Followed by  . [START ON 07/19/2016] cloNIDine (CATAPRES) tablet 0.1 mg  0.1 mg Oral QAC breakfast Laverle Hobby, PA-C      . dicyclomine (BENTYL) tablet 20 mg  20 mg Oral Q6H PRN Laverle Hobby, PA-C      . gabapentin (NEURONTIN) capsule 300 mg  300 mg Oral TID Patrecia Pour, NP   300 mg at 07/14/16 1148  .  guaiFENesin (MUCINEX) 12 hr tablet 600 mg  600 mg Oral BID Laverle Hobby, PA-C   600 mg at 07/14/16 6010  . haloperidol (HALDOL) tablet 5 mg  5 mg Oral Q8H PRN Ursula Alert, MD       Or  . haloperidol lactate (HALDOL) injection 5 mg  5 mg Intramuscular Q8H PRN Ursula Alert, MD      . hydrOXYzine (ATARAX/VISTARIL) tablet 25 mg  25 mg Oral Q6H PRN Laverle Hobby, PA-C      . loperamide (IMODIUM) capsule 2-4 mg  2-4 mg Oral PRN Laverle Hobby, PA-C      . LORazepam (ATIVAN) tablet 1 mg  1 mg Oral Q6H PRN Laverle Hobby, PA-C      . LORazepam (ATIVAN) tablet 1 mg  1 mg Oral QID Laverle Hobby, PA-C   1 mg at 07/13/16 2200   Followed by  . [START ON 07/15/2016] LORazepam (ATIVAN) tablet 1 mg  1 mg Oral TID Laverle Hobby, PA-C       Followed by  . [START ON 07/16/2016] LORazepam (ATIVAN) tablet 1 mg  1 mg Oral BID Laverle Hobby, PA-C       Followed by  . [START ON 07/18/2016] LORazepam (ATIVAN) tablet 1 mg  1 mg Oral Daily Laverle Hobby, PA-C      . magnesium hydroxide (MILK OF MAGNESIA) suspension 30 mL  30 mL Oral Daily PRN Patrecia Pour, NP      . methocarbamol (ROBAXIN) tablet 500 mg  500 mg Oral Q8H PRN Laverle Hobby, PA-C      . multivitamin with minerals tablet 1 tablet  1 tablet Oral Daily Laverle Hobby, PA-C   1 tablet at 07/14/16 0954  . OLANZapine (ZYPREXA) tablet 5 mg  5 mg Oral Daily Saramma Eappen, MD   5 mg at 07/14/16 1148  . OLANZapine (ZYPREXA) tablet 5 mg  5 mg Oral QHS Saramma Eappen, MD      . ondansetron (ZOFRAN-ODT) disintegrating tablet 4 mg  4 mg Oral Q6H PRN Laverle Hobby, PA-C      . thiamine (B-1) injection 100 mg  100 mg Intramuscular Once [employer], PA-C      . thiamine (VITAMIN B-1) tablet 100 mg  100 mg Oral Daily Laverle Hobby, PA-C   100 mg at 07/14/16 0954  . traZODone (DESYREL) tablet 50 mg  50 mg Oral QHS Ursula Alert, MD        PTA Medications: Prescriptions Prior to Admission  Medication Sig Dispense Refill Last Dose  .  amantadine (SYMMETREL) 100 MG capsule Take 1 capsule (100 mg total) by mouth 2 (two) times daily. 60 capsule 0 Past Month at Unknown time  . hydrOXYzine (ATARAX/VISTARIL) 25 MG tablet Take 1 tablet (25 mg total) by mouth every 6 (six) hours as needed (anxiety/agitation or CIWA < or = 10). 30 tablet 0 Past Month at Unknown time  . risperiDONE (RISPERDAL) 1 MG tablet Take 1 tablet (1 mg total) by mouth 2 (two) times daily. 60 tablet 0 Past Month at Unknown time  . traZODone (DESYREL) 100 MG tablet Take 1 tablet (100 mg total) by mouth at bedtime. 30 tablet 0 Past Month at Unknown time    Treatment Modalities: Medication Management, Group therapy, Case management,  1 to 1 session with clinician, Psychoeducation, Recreational therapy.   Physician Treatment Plan for Primary Diagnosis: Schizoaffective disorder, bipolar type ([place]) Long Term Goal(s): Improvement in symptoms so as ready for discharge  Short Term Goals: Ability to identify triggers associated with substance abuse/mental health issues will improve  Medication Management: Evaluate patient's response, side effects, and tolerance of medication regimen.  Therapeutic Interventions: 1 to 1 sessions, Unit Group sessions and Medication administration.  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  Therapeutic Interventions: 1  to 1 sessions, Unit Group sessions and Medication administration.  Evaluation of Outcomes: Not Met   RN Treatment Plan for Primary Diagnosis: Schizoaffective disorder, bipolar type ([place]) Long Term Goal(s): Knowledge of disease and therapeutic regimen to maintain health will improve  Short Term Goals: Ability to demonstrate self-control and Compliance with prescribed medications will improve  Medication Management: RN will administer medications as ordered by provider, will assess and evaluate  patient's response and provide education to patient for prescribed medication. RN will report any adverse and/or side effects to prescribing provider.  Therapeutic Interventions: 1 on 1 counseling sessions, Psychoeducation, Medication administration, Evaluate responses to treatment, Monitor vital signs and CBGs as ordered, Perform/monitor CIWA, COWS, AIMS and Fall Risk screenings as ordered, Perform wound care treatments as ordered.  Evaluation of Outcomes: Not Met   LCSW Treatment Plan for Primary Diagnosis: Schizoaffective disorder, bipolar type ([place]) Long Term Goal(s): Safe transition to appropriate next level of care at discharge, Engage patient in therapeutic group addressing interpersonal concerns.  Short Term Goals: Engage patient in aftercare planning with referrals and resources and Increase skills for wellness and recovery  Therapeutic Interventions: Assess for all discharge needs, 1 to 1 time with Social worker, Explore available resources and support systems, Assess for adequacy in community support network, Educate family and significant other(s) on suicide prevention, Complete Psychosocial Assessment, Interpersonal group therapy.  Evaluation of Outcomes: Not Met   Progress in Treatment: Attending groups: Yes Participating in groups: Yes Taking medication as prescribed: Yes, MD continues to assess for medication changes as needed Toleration medication: Yes, no side effects reported at  this time Family/Significant other contact made: No  Patient understands diagnosis: Limited insight  Discussing patient identified problems/goals with staff: Yes Medical problems stabilized or resolved: Yes Denies suicidal/homicidal ideation: No Issues/concerns per patient self-inventory: None Other: N/A  New problem(s) identified: None identified at this time.   New Short Term/Long Term Goal(s): None identified at this time.   Discharge Plan or Barriers:  Return home and follow up outpatient   Reason for Continuation of Hospitalization: Anxiety Delusions  Depression Hallucinations Homicidal ideation Medication stabilization Suicidal ideation   Estimated Length of Stay: 3-5 days  Attendees: Patient: 07/14/2016  3:22 PM  Physician: Dr. Shea Evans 07/14/2016  3:22 PM  Nursing: Darrol Angel, RN  07/14/2016  3:22 PM  RN Care Manager: Lars Pinks 07/14/2016  3:22 PM  Social Worker: Ripley Fraise, [place] 07/14/2016  3:22 PM  Recreational Therapist: Winfield Cunas 07/14/2016  3:22 PM  Other: Radonna Ricker, Social Work Intern  07/14/2016  3:22 PM  Other:  07/14/2016  3:22 PM  Other: 07/14/2016  3:22 PM    Scribe for Treatment Team: Radonna Ricker, Social Work Intern 07/14/2016 3:22 PM
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
## 2016-07-14 NOTE — [facility] Suicide Risk Assessment (Signed)
[facility] INPATIENT:  Family/Significant Other Suicide Prevention Education  Suicide Prevention Education:  Patient Refusal for Family/Significant Other Suicide Prevention Education: The patient Garrett Arellano has refused to provide written consent for family/significant other to be provided Family/Significant Other Suicide Prevention Education during admission and/or prior to discharge.  Physician notified.  Ida RogueRodney B Olsen Mccutchan 07/14/2016, 4:50 PM

## 2016-07-14 NOTE — Progress Notes (Addendum)
D: Pt mood observed to be inconsistent throughout the shift. Pt have episodes where he's calm and cooperative and then immediately he becomes agitated, loud, fidgety, pacing and verbally aggressive. Pt difficult to redirect at times. Pt refused to take Ativan, Clonidine and Keppa. MD made aware. Pt denies any withdrawal symptoms. Pt do no appear to be withdrawing. Pt refused to allow staff to perform EKG. Pt stated "my heart is good, my heart is beating just fine" "If something happens I'm suing the hospital". MD made aware. Pt denies SI and stated that he's not trying to hurt himself. A:  Medications reviewed with pt. Medications administered as ordered per MD. Verbal support provided. Pt encouraged to attend groups. 15 minute checks performed for safety. R: Pt hostile and non compliant with tx.
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
## 2016-07-14 NOTE — Progress Notes (Signed)
Recreation Therapy Notes  INPATIENT RECREATION THERAPY ASSESSMENT  Patient Details Name: Faythe Casaito D Vogler MRN: [id number] DOB: 09/24/1975 Today's Date: 07/14/2016  Patient Stressors:  (Pt stated he had no stressors.)  Pt stated he didn't know why he was here.  Coping Skills:   Isolate, Avoidance, Exercise, Talking, Music, Sports  Personal Challenges: Anger, Communication, Decision-Making, Self-Esteem/Confidence, Trusting Others  Leisure Interests (2+):  Harold BarbanNature - Lawn care, Music - Play instrument, Individual - TV  Awareness of Community Resources:  Yes  Community Resources:  Research scientist (physical sciences)Movie Theaters, Engineering geologistLibrary  Current Use: Yes  Patient Strengths:  My mind, workout  Patient Identified Areas of Improvement:  Humbling myself; being slow to speak and quick to listen  Current Recreation Participation:  Daily  Patient Goal for Hospitalization:  "Get out of here"  [place]  [place] Residence[place]  Current SI (including self-harm):  No  Current HI:  No  Consent to Intern Participation: N/A   Caroll RancherMarjette Oswin Johal, LRT/CTRS  [name], Kyshon Tolliver A 07/14/2016, 1:17 PM

## 2016-07-14 NOTE — Progress Notes (Signed)
Recreation Therapy Notes  Date: 07/14/16 Time: 1000 Location: 500 Hall Dayroom  Group Topic: Leisure Education  Goal Area(s) Addresses:  Patient will identify positive leisure activities.  Patient will identify one positive benefit of participation in leisure activities.   Behavioral Response: Engaged  Intervention: Dry erase marker, white board, activities, eraser  Activity: Leisure Pictionary.  Leisure activities were written on strips of paper and placed inside of a can.  Patients would come up one at and time and draw a strip of paper from the can.  They would then draw whatever activity was one the paper on the board.  The remaining patients would attempt to guess what activity was being drawn.  The patient that guesses the activity, would get the next turn at drawing a picture.  Education:  Leisure Education, Building control surveyorDischarge Planning  Education Outcome: Acknowledges education/In group clarification offered/Needs additional education  Clinical Observations/Feedback: Pt was engaged and social with peers.  Pt was appropriate while in group.  Pt left early and did not return.   Caroll RancherMarjette Iam Lipson, LRT/CTRS         Lillia AbedLindsay, [name] A 07/14/2016 1:11 PM

## 2016-07-15 DIAGNOSIS — Z87891 Personal history of nicotine dependence: Secondary | ICD-10-CM

## 2016-07-15 DIAGNOSIS — Z818 Family history of other mental and behavioral disorders: Secondary | ICD-10-CM

## 2016-07-15 MED ORDER — DIPHENHYDRAMINE HCL 25 MG PO CAPS
50.0000 mg | ORAL_CAPSULE | Freq: Four times a day (QID) | ORAL | Status: DC | PRN
  Administered 2016-07-15 – 2016-07-19 (×6): 50 mg via ORAL
  Filled 2016-07-15 (×4): qty 2

## 2016-07-15 MED ORDER — CHLORPROMAZINE HCL 25 MG/ML IJ SOLN: 50.0000 mg | Freq: Four times a day (QID) | INTRAMUSCULAR | Status: DC | PRN

## 2016-07-15 MED ORDER — DIPHENHYDRAMINE HCL 50 MG/ML IJ SOLN
50.0000 mg | Freq: Once | INTRAMUSCULAR | Status: AC
  Administered 2016-07-15: 50 mg via INTRAMUSCULAR
  Filled 2016-07-15: qty 1

## 2016-07-15 MED ORDER — OLANZAPINE 10 MG PO TABS: 10.0000 mg | ORAL_TABLET | Freq: Every day | ORAL | Status: DC

## 2016-07-15 MED ORDER — LORAZEPAM 2 MG/ML IJ SOLN
INTRAMUSCULAR | Status: AC
  Administered 2016-07-15: 2 mg via INTRAMUSCULAR
  Filled 2016-07-15: qty 1

## 2016-07-15 MED ORDER — CHLORPROMAZINE HCL 25 MG PO TABS
50.0000 mg | ORAL_TABLET | Freq: Four times a day (QID) | ORAL | Status: DC | PRN
  Administered 2016-07-15 – 2016-07-16 (×2): 50 mg via ORAL
  Filled 2016-07-15 (×3): qty 2

## 2016-07-15 MED ORDER — DIPHENHYDRAMINE HCL 50 MG/ML IJ SOLN
INTRAMUSCULAR | Status: AC
  Filled 2016-07-15: qty 1

## 2016-07-15 MED ORDER — CHLORPROMAZINE HCL 25 MG/ML IJ SOLN
100.0000 mg | Freq: Once | INTRAMUSCULAR | Status: AC
  Administered 2016-07-15: 100 mg via INTRAMUSCULAR
  Filled 2016-07-15: qty 4

## 2016-07-15 MED ORDER — LORAZEPAM 2 MG/ML IJ SOLN: 2.0000 mg | Freq: Four times a day (QID) | INTRAMUSCULAR | Status: DC | PRN

## 2016-07-15 MED ORDER — OLANZAPINE 10 MG IM SOLR
10.0000 mg | Freq: Every day | INTRAMUSCULAR | Status: DC
  Administered 2016-07-15: 10 mg via INTRAMUSCULAR
  Filled 2016-07-15 (×4): qty 10

## 2016-07-15 MED ORDER — OLANZAPINE 5 MG PO TABS
5.0000 mg | ORAL_TABLET | Freq: Every day | ORAL | Status: DC
  Filled 2016-07-15: qty 1

## 2016-07-15 MED ORDER — DIPHENHYDRAMINE HCL 50 MG/ML IJ SOLN: 50.0000 mg | Freq: Four times a day (QID) | INTRAMUSCULAR | Status: DC | PRN

## 2016-07-15 MED ORDER — OLANZAPINE 10 MG IM SOLR
10.0000 mg | Freq: Once | INTRAMUSCULAR | Status: DC
  Filled 2016-07-15: qty 10

## 2016-07-15 MED ORDER — OLANZAPINE 10 MG PO TABS
10.0000 mg | ORAL_TABLET | Freq: Every day | ORAL | Status: DC
  Filled 2016-07-15 (×3): qty 1

## 2016-07-15 MED ORDER — CHLORPROMAZINE HCL 25 MG/ML IJ SOLN
INTRAMUSCULAR | Status: AC
  Administered 2016-07-15: 100 mg via INTRAMUSCULAR
  Filled 2016-07-15: qty 4

## 2016-07-15 MED ORDER — LORAZEPAM 2 MG/ML IJ SOLN
2.0000 mg | Freq: Once | INTRAMUSCULAR | Status: AC
  Administered 2016-07-15: 2 mg via INTRAMUSCULAR

## 2016-07-15 MED ORDER — LORAZEPAM 1 MG PO TABS
2.0000 mg | ORAL_TABLET | Freq: Four times a day (QID) | ORAL | Status: DC | PRN
  Administered 2016-07-16 – 2016-07-19 (×7): 2 mg via ORAL
  Filled 2016-07-15 (×7): qty 2

## 2016-07-15 MED ORDER — OLANZAPINE 10 MG IM SOLR: 5.0000 mg | Freq: Every day | INTRAMUSCULAR | Status: DC

## 2016-07-15 MED ORDER — OLANZAPINE 10 MG IM SOLR
5.0000 mg | Freq: Every day | INTRAMUSCULAR | Status: DC
  Filled 2016-07-15: qty 10

## 2016-07-15 NOTE — Progress Notes (Signed)
Post restraint note:  Pt is in his room sitting on the bed eating one of his dinner trays.  He is still gruff and irritable, but is cooperating with the MHTs assigned to him.  At this time, he says he is fine,  His BP is still elevated, but he seems to still be upset,  Pt continues to be on 2:1 observation.  Sitters are with pt.  Pt is safe at this time.

## 2016-07-15 NOTE — Progress Notes (Signed)
1:1 progress note:  Pt at this time up and in the hall, accompanied by the two MHTs assigned to him.  He wants to take medication, but earlier told one of the MHTs that he did not want "them" giving him medicine.  She offered to come to Retail bankerthe writer and see what he had and give it to him with the writer present), but he refused, saying that was not acceptable.  At this time, pt is agreeable to take the med from another staff RN that he was assigned to on his first night on the unit.  He took meds to help with agitation and sleep.  Pt returned to his room without incident.  Continue 2:1 for safety.  Sitters are with pt at this time.  Pt remains safe at this time.

## 2016-07-15 NOTE — Plan of Care (Signed)
Problem: Medication: Goal: Compliance with prescribed medication regimen will improve Outcome: Progressing Pt had to be encouraged to take medication. Pt refused clonidine stating he is not withdrawing.

## 2016-07-15 NOTE — [facility] Group Notes (Signed)
[facility]  07/15/2016 , 1:11 PM    Type of Therapy:  [facility] Presentation  Participation Level:  Active  Participation Quality:  Attentive  Affect:  Blunted  Cognitive:  Oriented  Insight:  Limited  Engagement in Therapy:  Engaged  Modes of Intervention:  Discussion, Education and Socialization  Summary of Progress/Problems:  Onalee HuaDavid from [facility] came to present his recovery story and play the guitar.  Sopped me in hall.  Wondered who is Dr was today.  I explained I did not know, but that his nurse could find out for him.  As he was walking away, was talking about refusing medication "because I am not retarded."  Garrett Arellano, Garrett Arellano 07/15/2016 , 1:11 PM

## 2016-07-15 NOTE — Progress Notes (Signed)
Pt came out of 4-point restraints at 1952 at which time pt had asked to be allowed to use the bathroom.  Pt had calmed down and responded that he would remain cooperative.  He got up after being released from the restraints and went to the bathroom next to the restraint room.  He was offered food and drink at which time he stated he would like his trays from dinner.  This Clinical research associatewriter offered to get the tray, but after writer turned to leave, pt told one of the techs, "I don't want that white woman to warm my food".  At that pont, the tech came to the hall galley and got the food to take to the pt to avoid another confrontation.  Pt seems to be comfortable with the MHTs who are with him.  He allowed another staff member to take his vital signs.  He continues to be observed 2:1 for his explosive behaviors.  Two sitters are with the patient.  Pt is safe at this time.

## 2016-07-15 NOTE — Progress Notes (Signed)
[facility] Second Phys[facility] Opinion Progress Note for Medication Administration to Non-consenting Patients (For Involuntarily Committed Patients)  Patient: Faythe Casaito D Hult Date of Birth: [id number] MRN: [id number]  Reason for the Medication: The patient, without the benefit of the specific treatment measure, is incapable of participating in any available treatment plan that will give the patient a realistic opportunity of improving the patient's condition.  Consideration of Side Effects: Consideration of the side effects related to the medication plan has been given.  Rationale for Medication Administration: Patient is seen and examined. Patient is a 41 YO male with a history of schizoaffective disorder who is seen for evaluation of forced medications. The record was reviewed and the patient remains psychotic with paranoid delusions, bizarre behavior and aggressive and threatening behavior on the unit. He is refusing oral medications and I believe it is in the patient's best interest as well as for his safety and the safety of staff and other patients on the unit that he receive forced medications.   Antonieta PertGreg Lawson Clary, MD 07/15/16  12:20 PM   This documentation is good for (7) seven days from the date of the MD signature. New documentation must be completed every seven (7) days with detailed justification in the medical record if the patient requires continued non-emergent administration of psychotropic medications.
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
## 2016-07-15 NOTE — [facility] Group Notes (Signed)
[facility] LCSW After[facility] Discharge Planning Group Note   07/15/2016 10:07 AM  Participation Quality:    Mood/Affect:  Angry and Irritable  Depression Rating:    Anxiety Rating:    Thoughts of Suicide:  UNK Will you contract for safety?   UNK  Current AVH:  Denies  Plan for Discharge/Comments:  Invited pt to group.  Declined.  Asked if he had any social work needs.  Declined.  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  Prickly.  Transportation Means:   Supports:  Ida Rogueodney B Coco Sharpnack

## 2016-07-15 NOTE — Progress Notes (Signed)
Patient refused all morning medications this shift. Stating "I am not crazy ain't shit wrong with me you all are not fucking me up with your medications. Nurse Practitioner Dominga FerryAgustin made aware.

## 2016-07-15 NOTE — Progress Notes (Signed)
Patient ID: Garrett Arellano, male   DOB: 11/07/1975, 41 y.o.   MRN: [id number]  Attempted to wake pt up but refused stating "I have blood in the ED let them use it". Importance of the blood draw explained to pt but covered head with blanket. Lab rescheduled.

## 2016-07-15 NOTE — Progress Notes (Signed)
Patient is angry and agitated.  Patient using aggressive and graphic language toward staff. Patient was instructed that he needed to keep his door open, patient then told staff "fuck you, I ain't keeping no fucking door open.  You lying bitch we don't have to keep the door open" Patient was noted to be actively hallucinating stating someone came down to talk to him about the vice president and the vice president is supposed to be in the white house.

## 2016-07-15 NOTE — Progress Notes (Signed)
At this time, pt is lying in bed with his eyes closed.  No distress observed.  Respirations even/unlabored.  Pt continues on 2:1 d/t his aggressive and destructive behaviors.  Sitters are at the bedside.  Pt remains safe at this time.

## 2016-07-15 NOTE — Progress Notes (Signed)
Recreation Therapy Notes  Date: 07/15/16 Time:  1000 Location: 500 Hall Dayroom  Group Topic: Communication, Team Building, Problem Solving  Goal Area(s) Addresses:  Patient will effectively work with peer towards shared goal.  Patient will identify skill used to make activity successful.  Patient will identify how skills used during activity can be used to reach post d/c goals.   Intervention: STEM Activity   Activity: [employer] Tower. In teams, patients were asked to build the tallest freestanding tower possible out of 15 pipe cleaners. Systematically resources were removed, for example patient ability to use both hands and patient ability to verbally communicate.    Education: Pharmacist, communityocial Skills, Building control surveyorDischarge Planning.   Education Outcome: Acknowledges education/In group clarification offered/Needs additional education.   Clinical Observations/Feedback: Pt did not attend group.   Caroll RancherMarjette Advait Arellano, LRT/CTRS     [name], Garrett Arellano 07/15/2016 12:10 PM

## 2016-07-15 NOTE — Progress Notes (Signed)
Pt is sitting on the side of his bed at this time.  According to the MHTs with him, he has been masturbating off and on, trying to get their attention.  The doctor had ordered an EKG earlier, but pt continues to refuse to allow any staff member to do the procedure.  Pt remains calm otherwise.  MHTs report they are trying to convince pt to take night time meds as he says he is sleepy and wants to go to sleep.  Will continue to work with patient to get him to take his medications.  Support and encouragement offered.  Pt continues safe with 2:1 observation.

## 2016-07-15 NOTE — Progress Notes (Signed)
CSW obtained authorization from [facility] (Auth# [id number]) prepared [facility] and [facility].  Awaiting review and acceptance, or wait list status for pt.  Timmothy EulerJean T. Kaylyn LimSutter, MSW, LCSWA Clinical Social Work Disposition [phone]

## 2016-07-15 NOTE — Progress Notes (Addendum)
[facility] MD Progress Note  07/15/2016 6:55 PM Garrett Arellano  MRN:  [id number] Subjective:  Patient now calmer- slightly drowsy. States he is "OK". Objective : I have discussed case with treatment team , with Dr. Lucianne Muss, with nursing staff and have seen patient at several times during the afternoon. Patient presented with increased irritability , anger earlier today , becoming loud, threatening, intimidating. Efforts to deescalate verbally not successful. He broke furniture in his room, threw items on floor, threw items at staff.  He required PRNs for severe agitation. Earlier today received Zyprexa IM, with minor improvement. Later received Thorazine , and Ativan .  [employer] and Security were called in for assistance  due to patient's level of agitation . Patient was , as discussed with treatment team, [place] [employer] analyst) , Dr. Lucianne Muss ( Medical Director) , placed on locked seclusion due to level of agitation, violent behaviors not responsive to de-escalation efforts. In the seclusion room he repeatedly  banged his head against the window pane, so as discussed with RN and above providers , patient was progressed to 4 point restraints. At this time he is presenting much calmer, drowsy but easily alertable , without psychomotor agitation at this time. Of note, patient was admitted to ED on 3/19, at which time BAL was 122. UDS was positive for cocaine and for cannabis- patient endorses regular, sometimes heavy alcohol consumption prior to admission, but at this time is not presenting with tremors, diaphoresis. He has denied any prior history of alcohol WDL . Vitals have been elevated related to agitation. Full MMSE and orientation have been difficult to assess due to agitation and lack of cooperation, but patient has not presented with fluctuating sensorium or symptoms of confusion, and has repeatedly made statements indicative of knowing he is in hospital .  Principal Problem: Schizoaffective disorder,  bipolar type (HCC) Diagnosis:   Patient Active Problem List   Diagnosis Date Noted  . Alcohol use disorder, severe, dependence (HCC) [F10.20] 07/14/2016  . Cocaine use disorder, severe, dependence (HCC) [F14.20] 07/14/2016  . Cannabis use disorder, severe, dependence (HCC) [F12.20] 07/14/2016  . 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 Alcohol abuse [F10.10] 04/26/2016  . 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 Schizoaffective disorder, bipolar type (HCC) [F25.0] 04/25/2016  . Polysubstance abuse [F19.10] 10/14/2014  . Acute psychosis [F23]   . Suicidal ideation [R45.851] 10/09/2014   Total Time spent with patient: 30 minutes  Psychiatric History - patient has been diagnosed with Schizoaffective Disorder and Substance abuse in the past . He has had prior psychiatric admissions   Past Medical History:  Past Medical History:  Diagnosis Date  . Bipolar 1 disorder (HCC)   . Schizophrenia (HCC)   . Seizure [facility])    History reviewed. No pertinent surgical history. Family History:  Family History  Problem Relation Age of Onset  . Mental illness Maternal Uncle    Family Psychiatric  History:  Social History:  History  Alcohol Use  . Yes    Comment: unwilling to provide specific details     History  Drug Use  . Types: Marijuana    Comment: unwilling to provide specific details    Social History   Social History  . Marital status: Single    Spouse name: N/A  . Number of children: N/A  . Years of education: N/A   Social History Main Topics  . Smoking status: Current  Every Day Smoker    Packs/day: 0.25    Types: Cigarettes  . Smokeless tobacco: Never Used     Comment: pt unable to provide length of use  . Alcohol use Yes     Comment: unwilling to provide specific details  . Drug use: Yes    Types: Marijuana     Comment: unwilling  to provide specific details  . Sexual activity: Not Asked   Other Topics Concern  . None   Social History Narrative  . None   Additional Social History:    Pain Medications: denies abuse Prescriptions: denies abuse Over the Counter: denies abuse History of alcohol / drug use?:  (unwilling to provide specifics) Longest period of sobriety (when/how long):  (unwilling to discuss) Negative Consequences of Use:  (unwilling to discuss)  Sleep: Fair  Appetite:  Fair  Current Medications: Current Facility-Administered Medications  Medication Dose Route Frequency Provider Last Rate Last Dose  . acetaminophen (TYLENOL) tablet 650 mg  650 mg Oral Q6H PRN Charm Rings, NP      . alum & mag hydroxide-simeth (MAALOX/MYLANTA) 200-200-20 MG/5ML suspension 30 mL  30 mL Oral Q4H PRN Charm Rings, NP      . amantadine (SYMMETREL) capsule 100 mg  100 mg Oral BID Jomarie Longs, MD   100 mg at 07/14/16 1819  . benzonatate (TESSALON) capsule 200 mg  200 mg Oral TID PRN Kerry Hough, PA-C   200 mg at 07/14/16 2105  . benztropine (COGENTIN) tablet 1 mg  1 mg Oral Q6H PRN Jomarie Longs, MD       Or  . benztropine mesylate (COGENTIN) injection 1 mg  1 mg Intramuscular Q6H PRN Jomarie Longs, MD      . carbamazepine (TEGRETOL XR) 12 hr tablet 200 mg  200 mg Oral BID Jomarie Longs, MD   200 mg at 07/14/16 1819  . clotrimazole (LOTRIMIN) 1 % cream   Topical BID Kerry Hough, PA-C      . diphenhydrAMINE (BENADRYL) 50 MG/ML injection           . diphenhydrAMINE (BENADRYL) 50 MG/ML injection           . diphenhydrAMINE (BENADRYL) capsule 50 mg  50 mg Oral Q6H PRN Kerry Hough, PA-C   50 mg at 07/14/16 2208  . guaiFENesin (MUCINEX) 12 hr tablet 600 mg  600 mg Oral BID Kerry Hough, PA-C   600 mg at 07/14/16 1820  . haloperidol (HALDOL) tablet 5 mg  5 mg Oral Q8H PRN Jomarie Longs, MD       Or  . haloperidol lactate (HALDOL) injection 5 mg  5 mg Intramuscular Q8H PRN Saramma Eappen, MD       . LORazepam (ATIVAN) tablet 1 mg  1 mg Oral Q6H PRN Kerry Hough, PA-C      . magnesium hydroxide (MILK OF MAGNESIA) suspension 30 mL  30 mL Oral Daily PRN Charm Rings, NP      . methocarbamol (ROBAXIN) tablet 500 mg  500 mg Oral Q8H PRN Kerry Hough, PA-C      . multivitamin with minerals tablet 1 tablet  1 tablet Oral Daily Kerry Hough, PA-C   1 tablet at 07/14/16 0954  . OLANZapine (ZYPREXA) injection 10 mg  10 mg Intramuscular Daily Garrett Cotta, MD   10 mg at 07/15/16 1547   Or  . OLANZapine (ZYPREXA) tablet 10 mg  10 mg Oral Daily Garrett Cotta, MD      .  OLANZapine (ZYPREXA) injection 10 mg  10 mg Intramuscular Once Garrett Cotta, MD      . ondansetron (ZOFRAN-ODT) disintegrating tablet 4 mg  4 mg Oral Q6H PRN Kerry Hough, PA-C      . thiamine (B-1) injection 100 mg  100 mg Intramuscular Once [employer], PA-C      . thiamine (VITAMIN B-1) tablet 100 mg  100 mg Oral Daily Kerry Hough, PA-C   100 mg at 07/14/16 0954  . traZODone (DESYREL) tablet 50 mg  50 mg Oral QHS Jomarie Longs, MD   50 mg at 07/14/16 2105    Lab Results: No results found for this or any previous visit (from the past 48 hour(s)).  Blood Alcohol level:  Lab Results  Component Value Date   ETH 122 (H) 07/12/2016   ETH 222 (H) 04/24/2016    Metabolic Disorder Labs: No results found for: HGBA1C, MPG No results found for: PROLACTIN No results found for: CHOL, TRIG, HDL, CHOLHDL, VLDL, LDLCALC  Physical Findings: AIMS: Facial and Oral Movements Muscles of Facial Expression: None, normal Lips and Perioral Area: None, normal Jaw: None, normal Tongue: None, normal,Extremity Movements Upper (arms, wrists, hands, fingers): None, normal Lower (legs, knees, ankles, toes): None, normal, Trunk Movements Neck, shoulders, hips: None, normal, Overall Severity Severity of abnormal movements (highest score from questions above): None, normal Incapacitation due to abnormal movements:  None, normal Patient's awareness of abnormal movements (rate only patient's report): No Awareness, Dental Status Current problems with teeth and/or dentures?: No Does patient usually wear dentures?: No  CIWA:  CIWA-Ar Total: 3 COWS:  COWS Total Score: 0  Musculoskeletal: Strength & Muscle Tone: within normal limits no tremors, no diaphoresis, no psychomotor agitation  Gait & Station: normal Patient leans: N/A  Psychiatric Specialty Exam: Physical Exam  ROS denies chest pain, no shortness of breath, no vomiting , no fever  Blood pressure (!) 149/104, pulse (!) 120, temperature 97.8 F (36.6 C), temperature source Oral, resp. rate 18, SpO2 100 %.There is no height or weight on file to calculate BMI.  General Appearance: Disheveled  Eye Contact:  Fair  Speech:  has been intermittently loud, angry thoughout the day, at this time better  Volume:  Increased  Mood:  angry, irritable   Affect:  very irritable, angry , at this time improving   Thought Process:  Disorganized  Orientation:  Other:  fully alert and attentive  Thought Content:  patient is denying any hallucinations . No delusions are expressed -(  had endorsed hallucinations on admission)   Suicidal Thoughts:  No  Homicidal Thoughts:  Yes, patient has made repeated threats of hurting, killing staff members today   Memory:  recent and remote fair   Judgement:  Impaired  Insight:  Lacking  Psychomotor Activity:  severely agitated earlier, better at this time  Concentration:  Concentration: Fair and Attention Span: Fair  Recall:  [employer] of Knowledge:  Fair  Language:  Fair  Akathisia:  Negative  Handed:  Right  AIMS (if indicated):     Assets:  Physical Health  ADL's:  Impaired  Cognition:  Impaired,  Mild  Sleep:  Number of Hours: 6.75   Assessment - patient has presented with severe agitation , irritability, violent behaviors, verbalizing threats towards staff. Efforts to de-escalate verbally not successful. Has  responded to Thorazine and Ativan IM ( earlier today did not appear to respond or only minimally so  to Zyprexa PRN)  Due to  level of agitation and self injurious behaviors by head banging in seclusion he has , as reviewed with Medical Director, Nursing Director, and Nursing Staff, been placed on 4 point restraints. I have been checking in and re-evaluating him at various times during this restraint period . At this time he is improved, calmer, drowsy, and vital signs have improved . He has asked for dinner and ginger ale.    Treatment Plan Summary: Daily contact with patient to assess and evaluate symptoms and progress in treatment, Medication management, Plan inpatient treatment  and medications as below Continue to manage with least restrictive level , depending on presentation , agitation  Continue to follow restraint policies , procedures at this time Have D/Cd Ativan detox protocol, which patient had been refusing . D/C Zyprexa  Thorazine  Horton Marshall/Ativan PRNs for agitation as needed, as per agitation protocol  Will , if patient allows, request EKG to monitor QTc. Recheck CBC , BMP, check  CK   Garrett CottaFernando A Cobos, MD 07/15/2016, 6:55 PM   Addendum 8,34 PM  Based on improvement, and as discussed with Nursing Staff, Dr. [name], patient was taken off restraints, and is currently calm, in room.  Garrett MassedFernando Cobos, MD

## 2016-07-15 NOTE — Progress Notes (Signed)
Post restraint note:  Pt is still eating dinner as he wanted two dinner trays.  He had two trays with his name on them in the hall galley which were given to him.   He voices no needs at this time.  Pt continues irritable, but cooperative with the two MHTs that are assigned to him.  He does better with staff who are [place], as he did not Ecologistwant writer to heat his food earlier.  Continue to monitor pt 2:1 for escalation in behavior.  Pt remains safe and cooperative at this time.

## 2016-07-16 DIAGNOSIS — F250 Schizoaffective disorder, bipolar type: Principal | ICD-10-CM

## 2016-07-16 DIAGNOSIS — F17210 Nicotine dependence, cigarettes, uncomplicated: Secondary | ICD-10-CM

## 2016-07-16 DIAGNOSIS — F1420 Cocaine dependence, uncomplicated: Secondary | ICD-10-CM

## 2016-07-16 DIAGNOSIS — F1020 Alcohol dependence, uncomplicated: Secondary | ICD-10-CM

## 2016-07-16 DIAGNOSIS — R45850 Homicidal ideations: Secondary | ICD-10-CM

## 2016-07-16 DIAGNOSIS — Z79899 Other long term (current) drug therapy: Secondary | ICD-10-CM

## 2016-07-16 DIAGNOSIS — F19980 Other psychoactive substance use, unspecified with psychoactive substance-induced anxiety disorder: Secondary | ICD-10-CM

## 2016-07-16 DIAGNOSIS — F1220 Cannabis dependence, uncomplicated: Secondary | ICD-10-CM

## 2016-07-16 MED ORDER — CLONIDINE HCL 0.1 MG PO TABS
0.1000 mg | ORAL_TABLET | Freq: Three times a day (TID) | ORAL | Status: DC
  Administered 2016-07-16 – 2016-07-20 (×10): 0.1 mg via ORAL
  Filled 2016-07-16 (×16): qty 1

## 2016-07-16 MED ORDER — CHLORPROMAZINE HCL 50 MG PO TABS
50.0000 mg | ORAL_TABLET | Freq: Three times a day (TID) | ORAL | Status: DC
  Administered 2016-07-16 – 2016-07-20 (×11): 50 mg via ORAL
  Filled 2016-07-16 (×14): qty 1

## 2016-07-16 NOTE — Progress Notes (Addendum)
[facility] MD Progress Note  07/16/2016 11:47 AM Garrett Arellano  MRN:  [id number] Subjective:  Patient is a 41 YO male Principal Problem: Schizoaffective disorder, bipolar type (HCC) Diagnosis:   Patient Active Problem List   Diagnosis Date Noted  . Alcohol use disorder, severe, dependence (HCC) [F10.20] 07/14/2016  . Cocaine use disorder, severe, dependence (HCC) [F14.20] 07/14/2016  . Cannabis use disorder, severe, dependence (HCC) [F12.20] 07/14/2016  . 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 Alcohol abuse [F10.10] 04/26/2016  . 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 Schizoaffective disorder, bipolar type (HCC) [F25.0] 04/25/2016  . Polysubstance abuse [F19.10] 10/14/2014  . Acute psychosis [F23]   . Suicidal ideation [R45.851] 10/09/2014   Total Time spent with patient: 15 minutes  Past Psychiatric History: see H&P  Past Medical History:  Past Medical History:  Diagnosis Date  . Bipolar 1 disorder (HCC)   . Schizophrenia (HCC)   . Seizure [facility])    History reviewed. No pertinent surgical history. Family History:  Family History  Problem Relation Age of Onset  . Mental illness Maternal Uncle    Family Psychiatric  History: see H&P Social History:  History  Alcohol Use  . Yes    Comment: unwilling to provide specific details     History  Drug Use  . Types: Marijuana    Comment: unwilling to provide specific details    Social History   Social History  . Marital status: Single    Spouse name: N/A  . Number of children: N/A  . Years of education: N/A   Social History Main Topics  . Smoking status: Current Every Day Smoker    Packs/day: 0.25    Types: Cigarettes  . Smokeless tobacco: Never Used     Comment: pt unable to provide length of use  . Alcohol use Yes     Comment: unwilling to provide specific details  . Drug use:  Yes    Types: Marijuana     Comment: unwilling to provide specific details  . Sexual activity: Not Asked   Other Topics Concern  . None   Social History Narrative  . None   Additional Social History:    Pain Medications: denies abuse Prescriptions: denies abuse Over the Counter: denies abuse History of alcohol / drug use?:  (unwilling to provide specifics) Longest period of sobriety (when/how long):  (unwilling to discuss) Negative Consequences of Use:  (unwilling to discuss)                    Sleep: Fair  Appetite:  Fair  Current Medications: Current Facility-Administered Medications  Medication Dose Route Frequency Provider Last Rate Last Dose  . acetaminophen (TYLENOL) tablet 650 mg  650 mg Oral Q6H PRN Charm Rings, NP      . alum & mag hydroxide-simeth (MAALOX/MYLANTA) 200-200-20 MG/5ML suspension 30 mL  30 mL Oral Q4H PRN Charm Rings, NP      . amantadine (SYMMETREL) capsule 100 mg  100 mg Oral BID Jomarie Longs, MD   100 mg at 07/14/16 1819  . benzonatate (TESSALON) capsule 200 mg  200 mg Oral TID PRN Kerry Hough, PA-C   200 mg at 07/14/16 2105  . benztropine (COGENTIN) tablet 1 mg  1 mg Oral Q6H PRN Jomarie Longs, MD       Or  . benztropine mesylate (COGENTIN) injection 1 mg  1 mg Intramuscular Q6H  PRN Jomarie Longs, MD      . carbamazepine (TEGRETOL XR) 12 hr tablet 200 mg  200 mg Oral BID Jomarie Longs, MD   200 mg at 07/14/16 1819  . chlorproMAZINE (THORAZINE) injection 50 mg  50 mg Intramuscular Q6H PRN Craige Cotta, MD       Or  . chlorproMAZINE (THORAZINE) tablet 50 mg  50 mg Oral Q6H PRN Craige Cotta, MD   50 mg at 07/15/16 2147  . clotrimazole (LOTRIMIN) 1 % cream   Topical BID Kerry Hough, PA-C      . diphenhydrAMINE (BENADRYL) capsule 50 mg  50 mg Oral Q6H PRN Kerry Hough, PA-C   50 mg at 07/14/16 2208  . diphenhydrAMINE (BENADRYL) capsule 50 mg  50 mg Oral Q6H PRN Craige Cotta, MD   50 mg at 07/15/16 2146   Or  .  diphenhydrAMINE (BENADRYL) injection 50 mg  50 mg Intramuscular Q6H PRN Craige Cotta, MD      . guaiFENesin (MUCINEX) 12 hr tablet 600 mg  600 mg Oral BID Kerry Hough, PA-C   600 mg at 07/15/16 2148  . haloperidol (HALDOL) tablet 5 mg  5 mg Oral Q8H PRN Jomarie Longs, MD       Or  . haloperidol lactate (HALDOL) injection 5 mg  5 mg Intramuscular Q8H PRN Saramma Eappen, MD      . LORazepam (ATIVAN) injection 2 mg  2 mg Intramuscular Q6H PRN Craige Cotta, MD       Or  . LORazepam (ATIVAN) tablet 2 mg  2 mg Oral Q6H PRN Craige Cotta, MD      . LORazepam (ATIVAN) tablet 1 mg  1 mg Oral Q6H PRN Kerry Hough, PA-C      . magnesium hydroxide (MILK OF MAGNESIA) suspension 30 mL  30 mL Oral Daily PRN Charm Rings, NP      . methocarbamol (ROBAXIN) tablet 500 mg  500 mg Oral Q8H PRN Kerry Hough, PA-C      . multivitamin with minerals tablet 1 tablet  1 tablet Oral Daily Kerry Hough, PA-C   1 tablet at 07/14/16 0954  . OLANZapine (ZYPREXA) injection 10 mg  10 mg Intramuscular Once Craige Cotta, MD      . ondansetron (ZOFRAN-ODT) disintegrating tablet 4 mg  4 mg Oral Q6H PRN Kerry Hough, PA-C      . thiamine (B-1) injection 100 mg  100 mg Intramuscular Once [employer], PA-C      . thiamine (VITAMIN B-1) tablet 100 mg  100 mg Oral Daily Kerry Hough, PA-C   100 mg at 07/14/16 0954  . traZODone (DESYREL) tablet 50 mg  50 mg Oral QHS Jomarie Longs, MD   50 mg at 07/15/16 2201    Lab Results: No results found for this or any previous visit (from the past 48 hour(s)).  Blood Alcohol level:  Lab Results  Component Value Date   ETH 122 (H) 07/12/2016   ETH 222 (H) 04/24/2016    Metabolic Disorder Labs: No results found for: HGBA1C, MPG No results found for: PROLACTIN No results found for: CHOL, TRIG, HDL, CHOLHDL, VLDL, LDLCALC  Physical Findings: AIMS: Facial and Oral Movements Muscles of Facial Expression: None, normal Lips and Perioral Area: None,  normal Jaw: None, normal Tongue: None, normal,Extremity Movements Upper (arms, wrists, hands, fingers): None, normal Lower (legs, knees, ankles, toes): None, normal, Trunk Movements Neck, shoulders,  hips: None, normal, Overall Severity Severity of abnormal movements (highest score from questions above): None, normal Incapacitation due to abnormal movements: None, normal Patient's awareness of abnormal movements (rate only patient's report): No Awareness, Dental Status Current problems with teeth and/or dentures?: No Does patient usually wear dentures?: No  CIWA:  CIWA-Ar Total: 0 COWS:  COWS Total Score: 0  Musculoskeletal: Strength & Muscle Tone: within normal limits Gait & Station: normal Patient leans: N/A  Psychiatric Specialty Exam: Physical Exam  Nursing note and vitals reviewed.   ROS  Blood pressure (!) 150/105, pulse (!) 125, temperature 98.3 F (36.8 C), temperature source Oral, resp. rate 16, SpO2 100 %.There is no height or weight on file to calculate BMI.  General Appearance: Disheveled  Eye Contact:  Fair  Speech:  Slow  Volume:  Decreased  Mood:  sedated  Affect:  Appropriate  Thought Process:  Goal Directed  Orientation:  Full (Time, Place, and Person)  Thought Content:  Negative  Suicidal Thoughts:  No  Homicidal Thoughts:  Yes.  without intent/plan  Memory:  Immediate;   Fair  Judgement:  Poor  Insight:  Lacking  Psychomotor Activity:  Decreased  Concentration:  Concentration: Fair  Recall:  [employer]Fair  Fund of Knowledge:  Fair  Language:  Fair  Akathisia:  No  Handed:  Right  AIMS (if indicated):     Assets:  Desire for Improvement  ADL's:  Intact  Cognition:  WNL  Sleep:  Number of Hours: 6.25     Treatment Plan Summary: Daily contact with patient to assess and evaluate symptoms and progress in treatment, Medication management and Plan Patient is seen and examined.  Patient is a 41 YO male with a history of schizoaffective disorder who is seen in  follow up. Patient was seen yesterday for evaluation of forced medications. The record was reviewed and the patient remains psychotic with paranoid delusions, bizarre behavior and aggressive and threatening behavior on the unit. He was refusing oral medications and I believed it is in the patient's best interest as well as for his safety and the safety of staff and other patients on the unit that he receive forced medications.  I saw the patient today for regular follow up visit. The patient has had a significant amount of sleep after the events of yesterday and last night. He was given multiple medications including zyprexa ( which appeared to lead to increased agitation) and thorazine ( which has appeared to help). He was sleeping this am and was easily aroused. He denied any SI or HI and continued to emphasize his need to be discharged to "get my daughter and go to work". He remains on thorazine 50 mg po/IM q 6 hors prn, tegretol XR 200 mg BID, haldol prn, ativan prn and trazodone. Staff is attempting to give him his am meds now. I am going to place him on standing po thorazine 50 mg TID as well as the prns. Attempts are now in the process to [facility] given his violence and the potential for violence that he has exhibited.  Addendum. Patient has been noted to have increased BP and tachycardia. Patient denied any history of HTN. He stated he is willing to take meds for his pressure, but did refuse an EKG. Given hispositive for cocaine, not great to give B-blockers, so will stick with clonidine in the short run. We will start with 0.1 mg TID and monitor in the short run.  Antonieta PertGreg Lawson Densil Ottey,  MD 07/16/2016, 11:47 AM
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
## 2016-07-16 NOTE — Progress Notes (Signed)
D: Patient reports some anxiety and keeps stating " I am ready to go"; patient states " there is nothing wrong with me"    A: Continue 2:1 observation  R: Patient is laughing and joking on the unit; patient is not attending groups; patient is making racial remarks; patient is flirtatious with the staff at times but can be redirected; patient walks down the hallway a lot or stands in hallway sometimes just quiet and not talking

## 2016-07-16 NOTE — Plan of Care (Addendum)
Problem: Safety: Goal: Periods of time without injury will increase Outcome: Progressing Remains a hall fall risk (hx of seizures), denies SI/HI/AVH at this time, Q 15 checks in place, 2:1 initiated and maintained.

## 2016-07-16 NOTE — Progress Notes (Addendum)
[facility] Rod[facility], he contacted [facility] to appeal their [facility].  He related that [facility] has re-reviewed pt's chart info and are willing to put patient on prioritized waiting list.  A daily update needs to be [facility] via fax [facility] [phone].  This Clinical research associatewriter will relay this information to weekend Disposition CSW.  Timmothy EulerJean T. Kaylyn LimSutter, MSW, LCSWA Clinical Social Work Disposition [phone]  [place] and related the above information.  CPS report cancelled unless caregiver's representative fails to pick pt up.

## 2016-07-16 NOTE — [facility] Group Notes (Signed)
[facility] LCSW Group Therapy  07/16/2016  1:05 PM  Type of Therapy:  Group therapy  Participation Level:  Active  Participation Quality:  Attentive  Affect:  Flat  Cognitive:  Oriented  Insight:  Limited  Engagement in Therapy:  Limited  Modes of Intervention:  Discussion, Socialization  Summary of Progress/Problems:  Chaplain was here to lead a group on themes of hope and courage. Pt not invited due to previous outbursts and potential for violence. Daryel Geraldorth, Royalti Schauf B 07/16/2016 2:40 PM

## 2016-07-16 NOTE — Progress Notes (Signed)
Patient's forehead has no signs of worsening symptoms.  No skin abrasion evident.  No c/o pain.

## 2016-07-16 NOTE — Progress Notes (Signed)
Pt resting in bed with eyes closed.  No distress observed.  Respirations even/unlabored.  Continue 2:1 for safety as pt is physically and verbally aggressive when awake.  Two sitters are with pt at this time.  Pt remains safe.

## 2016-07-16 NOTE — Progress Notes (Signed)
Pt is lying in bed with eyes closed.  No distress observed.  Respirations even unlabored.  Continue 2:1 for safety d/t pt's aggressive outbursts and behaviors.  Sitters at bedside.  Pt is safe at this time.

## 2016-07-16 NOTE — Progress Notes (Signed)
RN 1:1 NOTE  D: Patient denies SI/HI and A/V hallucinations; patient denies pain at this time; patient reports that he slept well; patient stated " Im not taking what I don't want to take"; patient   A: Continue 2:1 observation at this time  R: Patient was irritable and anxious but did take his medications; patient being flirtatious with the staff; patient was masturbating at one time and was redirected to stop; patient was being cooperative overall with redirection

## 2016-07-16 NOTE — Progress Notes (Signed)
RN 1:1 NOTE  D: Unable to assess the patient at this time because he is resting  A: Continue 2:1 observation  R: Patient is resting in the bed at this time; will continue to monitor for any distress

## 2016-07-16 NOTE — Progress Notes (Signed)
Recreation Therapy Notes  Date: 07/16/16 Time: 1000 Location: 500 Hall Dayroom  Group Topic: Communication  Goal Area(s) Addresses:  Patient will effectively communicate with peers in group.  Patient will verbalize benefit of healthy communication. Patient will verbalize positive effect of healthy communication on post d/c goals.  Patient will identify communication techniques that made activity effective for group.   Intervention:  Chairs, 3 small beach balls   Activity: Group Juggle.  Patients were placed in a circle.  Patients were given one ball to toss back and forth to each other.  LRT would time the group to see how long they could keep the ball(s) going before one came to a complete stop. Once the group gained momentum with the first ball, LRT introduced a second ball into the rotation.  Patients were again allowed to get momentum with the two balls.  After awhile, LRT introduced a third ball into the rotation.  Patients were to keep all three balls going.  If at any point if one of the balls came to a complete stop, the LRT would start the time over and patients would start back with one ball.  Education: Communication, Discharge Planning  Education Outcome: Acknowledges understanding/In group clarification offered/Needs additional education.   Clinical Observations/Feedback: Pt did not attend group.   Caroll RancherMarjette Hollis Oh, LRT/CTRS         [name], Ruford Dudzinski A 07/16/2016 12:57 PM

## 2016-07-17 NOTE — Progress Notes (Signed)
[facility] and on the waitlist today, 07/17/16. Updated nurses and doctor's [facility].  CSW in disposition will continue to follow up with referral.  Melbourne Abtsatia Sadiya Durand, LCSWA Disposition staff 07/17/2016 11:29 AM

## 2016-07-17 NOTE — Progress Notes (Signed)
2:1 Note @ 0600 Next note @ 1000   Pt. is seen laying in bed with eyes closed. Respirations even and unlabored. No signs of distress.  Q 15 checks in effect. 2:1 initiated and maintained. Will continue to monitor.

## 2016-07-17 NOTE — Progress Notes (Signed)
[facility]  07/17/2016 12:10 PM Garrett Arellano D Vigna  MRN:  [id number] Subjective:  Patient states that he needs to go home and get his daughter to church.  Objective:  Patient is a 41 YO male with a history of schizoaffective disorder. On forced medications order.  Per nursing patient is calmer today.  Changed to 1:1 nursing care. However, remains psychotic with paranoid delusions, bizarre behavior and aggressive and threatening behavior on the unit.  Principal Problem: Schizoaffective disorder, bipolar type (HCC) Diagnosis:   Patient Active Problem List   Diagnosis Date Noted  . Alcohol use disorder, severe, dependence (HCC) [F10.20] 07/14/2016  . Cocaine use disorder, severe, dependence (HCC) [F14.20] 07/14/2016  . Cannabis use disorder, severe, dependence (HCC) [F12.20] 07/14/2016  . 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 Alcohol abuse [F10.10] 04/26/2016  . 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 Schizoaffective disorder, bipolar type (HCC) [F25.0] 04/25/2016  . Polysubstance abuse [F19.10] 10/14/2014  . Acute psychosis [F23]   . Suicidal ideation [R45.851] 10/09/2014   Total Time spent with patient: 15 minutes  Past Psychiatric History: see H&P  Past Medical History:  Past Medical History:  Diagnosis Date  . Bipolar 1 disorder (HCC)   . Schizophrenia (HCC)   . Seizure [facility](HCC)    History reviewed. No pertinent surgical history. Family History:  Family History  Problem Relation Age of Onset  . Mental illness Maternal Uncle    Family Psychiatric  History: see H&P Social History:  History  Alcohol Use  . Yes    Comment: unwilling to provide specific details     History  Drug Use  . Types: Marijuana    Comment: unwilling to provide specific details    Social History   Social History  . Marital status: Single    Spouse  name: N/A  . Number of children: N/A  . Years of education: N/A   Social History Main Topics  . Smoking status: Current Every Day Smoker    Packs/day: 0.25    Types: Cigarettes  . Smokeless tobacco: Never Used     Comment: pt unable to provide length of use  . Alcohol use Yes     Comment: unwilling to provide specific details  . Drug use: Yes    Types: Marijuana     Comment: unwilling to provide specific details  . Sexual activity: Not Asked   Other Topics Concern  . None   Social History Narrative  . None   Additional Social History:    Pain Medications: denies abuse Prescriptions: denies abuse Over the Counter: denies abuse History of alcohol / drug use?:  (unwilling to provide specifics) Longest period of sobriety (when/how long):  (unwilling to discuss) Negative Consequences of Use:  (unwilling to discuss)                    Sleep: Fair  Appetite:  Fair  Current Medications: Current Facility-Administered Medications  Medication Dose Route Frequency Provider Last Rate Last Dose  . acetaminophen (TYLENOL) tablet 650 mg  650 mg Oral Q6H PRN Charm RingsJamison Y Lord, NP      . alum & mag hydroxide-simeth (MAALOX/MYLANTA) 200-200-20 MG/5ML suspension 30 mL  30 mL Oral Q4H PRN Charm RingsJamison Y Lord, NP      . amantadine (SYMMETREL) capsule 100 mg  100 mg Oral BID Jomarie LongsSaramma Eappen, MD   100 mg at 07/17/16 1133  . benzonatate (  TESSALON) capsule 200 mg  200 mg Oral TID PRN Kerry Hough, PA-C   200 mg at 07/14/16 2105  . benztropine (COGENTIN) tablet 1 mg  1 mg Oral Q6H PRN Jomarie Longs, MD       Or  . benztropine mesylate (COGENTIN) injection 1 mg  1 mg Intramuscular Q6H PRN Jomarie Longs, MD      . carbamazepine (TEGRETOL XR) 12 hr tablet 200 mg  200 mg Oral BID Jomarie Longs, MD   200 mg at 07/17/16 1145  . chlorproMAZINE (THORAZINE) injection 50 mg  50 mg Intramuscular Q6H PRN Craige Cotta, MD       Or  . chlorproMAZINE (THORAZINE) tablet 50 mg  50 mg Oral Q6H PRN  Craige Cotta, MD   50 mg at 07/16/16 2056  . chlorproMAZINE (THORAZINE) tablet 50 mg  50 mg Oral TID Antonieta Pert, MD   50 mg at 07/17/16 1133  . cloNIDine (CATAPRES) tablet 0.1 mg  0.1 mg Oral TID Antonieta Pert, MD   0.1 mg at 07/17/16 1145  . clotrimazole (LOTRIMIN) 1 % cream   Topical BID Kerry Hough, PA-C      . diphenhydrAMINE (BENADRYL) capsule 50 mg  50 mg Oral Q6H PRN Kerry Hough, PA-C   50 mg at 07/14/16 2208  . diphenhydrAMINE (BENADRYL) capsule 50 mg  50 mg Oral Q6H PRN Craige Cotta, MD   50 mg at 07/16/16 2050   Or  . diphenhydrAMINE (BENADRYL) injection 50 mg  50 mg Intramuscular Q6H PRN Craige Cotta, MD      . guaiFENesin (MUCINEX) 12 hr tablet 600 mg  600 mg Oral BID Kerry Hough, PA-C   600 mg at 07/17/16 1134  . haloperidol (HALDOL) tablet 5 mg  5 mg Oral Q8H PRN Jomarie Longs, MD   5 mg at 07/16/16 2227   Or  . haloperidol lactate (HALDOL) injection 5 mg  5 mg Intramuscular Q8H PRN Saramma Eappen, MD      . LORazepam (ATIVAN) injection 2 mg  2 mg Intramuscular Q6H PRN Craige Cotta, MD       Or  . LORazepam (ATIVAN) tablet 2 mg  2 mg Oral Q6H PRN Craige Cotta, MD   2 mg at 07/17/16 1147  . magnesium hydroxide (MILK OF MAGNESIA) suspension 30 mL  30 mL Oral Daily PRN Charm Rings, NP      . methocarbamol (ROBAXIN) tablet 500 mg  500 mg Oral Q8H PRN Kerry Hough, PA-C      . multivitamin with minerals tablet 1 tablet  1 tablet Oral Daily Kerry Hough, PA-C   1 tablet at 07/17/16 1144  . OLANZapine (ZYPREXA) injection 10 mg  10 mg Intramuscular Once Craige Cotta, MD      . thiamine (B-1) injection 100 mg  100 mg Intramuscular Once [employer], PA-C      . thiamine (VITAMIN B-1) tablet 100 mg  100 mg Oral Daily Kerry Hough, PA-C   100 mg at 07/17/16 1134  . traZODone (DESYREL) tablet 50 mg  50 mg Oral QHS Jomarie Longs, MD   50 mg at 07/16/16 2050    Lab Results: No results found for this or any previous visit (from  the past 48 hour(s)).  Blood Alcohol level:  Lab Results  Component Value Date   ETH 122 (H) 07/12/2016   ETH 222 (H) 04/24/2016    Metabolic Disorder Labs:  No results found for: HGBA1C, MPG No results found for: PROLACTIN No results found for: CHOL, TRIG, HDL, CHOLHDL, VLDL, LDLCALC  Physical Findings: AIMS: Facial and Oral Movements Muscles of Facial Expression: None, normal Lips and Perioral Area: None, normal Jaw: None, normal Tongue: None, normal,Extremity Movements Upper (arms, wrists, hands, fingers): None, normal Lower (legs, knees, ankles, toes): None, normal, Trunk Movements Neck, shoulders, hips: None, normal, Overall Severity Severity of abnormal movements (highest score from questions above): None, normal Incapacitation due to abnormal movements: None, normal Patient's awareness of abnormal movements (rate only patient's report): No Awareness, Dental Status Current problems with teeth and/or dentures?: No Does patient usually wear dentures?: No  CIWA:  CIWA-Ar Total: 2 COWS:  COWS Total Score: 3  Musculoskeletal: Strength & Muscle Tone: within normal limits Gait & Station: normal Patient leans: N/A  Psychiatric Specialty Exam:   Physical Exam  Nursing note and vitals reviewed.   ROS  Blood pressure (!) 152/99, pulse (!) 102, temperature 98.3 F (36.8 C), temperature source Oral, resp. rate 16, SpO2 100 %.There is no height or weight on file to calculate BMI.  General Appearance: Disheveled  Eye Contact:  Fair  Speech:  Slow  Volume:  Decreased  Mood:  sedated  Affect:  Appropriate  Thought Process:  Goal Directed  Orientation:  Full (Time, Place, and Person)  Thought Content:  Negative  Suicidal Thoughts:  No  Homicidal Thoughts:  Yes.  without intent/plan  Memory:  Immediate;   Fair  Judgement:  Poor  Insight:  Lacking  Psychomotor Activity:  Decreased  Concentration:  Concentration: Fair  Recall:  [employer] of Knowledge:  Fair  Language:   Fair  Akathisia:  No  Handed:  Right  AIMS (if indicated):     Assets:  Desire for Improvement  ADL's:  Intact  Cognition:  WNL  Sleep:  Number of Hours: 4.25    Treatment Plan Summary: Daily contact with patient to assess and evaluate symptoms and progress in treatment, Medication management and Plan Patient is seen and examined.   Continues to be on forced meds. Agitation/psychosis:   Thorazine 50 mg TID and  Thorazine 50 mg po/IM q 6 hrs prn Mood stabilization:  Tegretol XR 200 mg BID Haldol prn, Ativan prn and Trazodone insomnia.  Dispo:   Attempts to have the patient transferred to [facility] given his violence and the potential for violence that he has exhibited.  Lindwood Qua, NP [facility] 07/17/2016, 12:10 PM  Reviewed notes and agree with plan

## 2016-07-17 NOTE — [facility] Group Notes (Signed)
[facility] Group Notes: (Clinical Social Work)   07/17/2016      Type of Therapy:  Group Therapy   Participation Level:  Did Not Attend - asked by staff not to awaken him   Ambrose MantleMareida Grossman-Orr, LCSW 07/17/2016, 12:57 PM

## 2016-07-17 NOTE — Progress Notes (Signed)
1-1 note: Garrett Arellano is in bed and resting comfortably at this time. Garrett Arellano was mildly agitated and getting loud earlier in the evening around 8pm. Staff spoke with Garrett Arellano and he asked for his medications and said that he just wanted to go to bed. He spoke about how he feels the same every day but that the events change. Garrett Arellano did receive medications without incident and did not verbalize any complaints of pain earlier. Garrett Arellano verbalized understanding that if he needed anything he was to come to this nurse or his 1-1 sitter and make his needs known. A. 1-1 continues at this time. R. Safety maintained, will continue to monitor.

## 2016-07-17 NOTE — Progress Notes (Signed)
1:1 Note: Patient is in bed sleeping.  Breathing is even and unlabored.  No behavioral issues reported or noted.  Routine safety checks continues.  Patient is safe on the unit with supervision.

## 2016-07-17 NOTE — Progress Notes (Addendum)
2:1 Note @ 0200 Next note @ 0600   Pt. is calm and currently awake. Seen moving around restless in bed. No signs of distress. No c/o. Q 15 checks in effect. 2:1 initiated and maintained. Will continue to monitor.

## 2016-07-17 NOTE — Progress Notes (Signed)
1:1 Note: Patient maintained on constant supervision for safety.  Patient is up and in the dayroom interacting with peers and staff.  Medication given as prescribed without difficulty.  Patient affect and mood is appropriate to situation.  Patient offered support and encouragement as needed.  Patient verbalizes readiness for discharge.  States he is ready to come off supervision.  Routine safety checks continues.  Patient is safe on the unit with supervision.

## 2016-07-17 NOTE — Progress Notes (Signed)
1:1 Note: Patient maintained on constant supervision for safety.  Patient is visible in milieu.  No behavioral issues noted.  Affect and mood is appropriate to situation.  Medication given as prescribed without difficulty.  Patient is safe on the unit with supervision.

## 2016-07-18 NOTE — Progress Notes (Signed)
D: Pt awake, animated, in dayroom eating supper at this time. Pt is more approachable in comparison to this afternoon. Took his evening medications as scheduled and allowed staff to do his vitals without issues. Refused scheduled blood work "I did one across the street and that's it". A: Support offered. Medications given as per order. 1:1 observation maintained. Staff in attendance.  R: Pt safe on unit. Exhibit no outburst at present. No physical distress evident at this time.

## 2016-07-18 NOTE — Progress Notes (Signed)
[facility]  07/18/2016 11:10 AM Faythe Casaito D [place]  MRN:  [id number] Subjective:  Patient remains guarded. Mood can get irritable  Patient remains on one to one, remains guarded, gives brief answers. Remains at times unpredictable . Currently is on force meds and is tolerating .  Principal Problem: Schizoaffective disorder, bipolar type (HCC) Diagnosis:   Patient Active Problem List   Diagnosis Date Noted  . Alcohol use disorder, severe, dependence (HCC) [F10.20] 07/14/2016  . Cocaine use disorder, severe, dependence (HCC) [F14.20] 07/14/2016  . Cannabis use disorder, severe, dependence (HCC) [F12.20] 07/14/2016  . 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 Alcohol abuse [F10.10] 04/26/2016  . 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 Schizoaffective disorder, bipolar type (HCC) [F25.0] 04/25/2016  . Polysubstance abuse [F19.10] 10/14/2014  . Acute psychosis [F23]   . Suicidal ideation [R45.851] 10/09/2014   Total Time spent with patient: 15 minutes  Past Psychiatric History: see H&P  Past Medical History:  Past Medical History:  Diagnosis Date  . Bipolar 1 disorder (HCC)   . Schizophrenia (HCC)   . Seizure [facility](HCC)    History reviewed. No pertinent surgical history. Family History:  Family History  Problem Relation Age of Onset  . Mental illness Maternal Uncle    Family Psychiatric  History: see H&P Social History:  History  Alcohol Use  . Yes    Comment: unwilling to provide specific details     History  Drug Use  . Types: Marijuana    Comment: unwilling to provide specific details    Social History   Social History  . Marital status: Single    Spouse name: N/A  . Number of children: N/A  . Years of education: N/A   Social History Main Topics  . Smoking status: Current Every Day Smoker    Packs/day: 0.25    Types:  Cigarettes  . Smokeless tobacco: Never Used     Comment: pt unable to provide length of use  . Alcohol use Yes     Comment: unwilling to provide specific details  . Drug use: Yes    Types: Marijuana     Comment: unwilling to provide specific details  . Sexual activity: Not Asked   Other Topics Concern  . None   Social History Narrative  . None   Additional Social History:    Pain Medications: denies abuse Prescriptions: denies abuse Over the Counter: denies abuse History of alcohol / drug use?:  (unwilling to provide specifics) Longest period of sobriety (when/how long):  (unwilling to discuss) Negative Consequences of Use:  (unwilling to discuss)                    Sleep: Fair  Appetite:  Fair  Current Medications: Current Facility-Administered Medications  Medication Dose Route Frequency Provider Last Rate Last Dose  . acetaminophen (TYLENOL) tablet 650 mg  650 mg Oral Q6H PRN Charm RingsJamison Y Lord, NP      . alum & mag hydroxide-simeth (MAALOX/MYLANTA) 200-200-20 MG/5ML suspension 30 mL  30 mL Oral Q4H PRN Charm RingsJamison Y Lord, NP      . amantadine (SYMMETREL) capsule 100 mg  100 mg Oral BID Jomarie LongsSaramma Eappen, MD   100 mg at 07/18/16 0855  . benzonatate (TESSALON) capsule 200 mg  200 mg Oral TID PRN Kerry HoughSpencer E Simon, PA-C   200 mg at 07/18/16 0709  . benztropine (COGENTIN) tablet 1 mg  1 mg Oral Q6H PRN Jomarie Longs, MD       Or  . benztropine mesylate (COGENTIN) injection 1 mg  1 mg Intramuscular Q6H PRN Jomarie Longs, MD      . carbamazepine (TEGRETOL XR) 12 hr tablet 200 mg  200 mg Oral BID Saramma Eappen, MD   200 mg at 07/18/16 0900  . chlorproMAZINE (THORAZINE) injection 50 mg  50 mg Intramuscular Q6H PRN Craige Cotta, MD       Or  . chlorproMAZINE (THORAZINE) tablet 50 mg  50 mg Oral Q6H PRN Craige Cotta, MD   50 mg at 07/16/16 2056  . chlorproMAZINE (THORAZINE) tablet 50 mg  50 mg Oral TID Antonieta Pert, MD   50 mg at 07/18/16 0855  . cloNIDine (CATAPRES)  tablet 0.1 mg  0.1 mg Oral TID Antonieta Pert, MD   0.1 mg at 07/18/16 0900  . clotrimazole (LOTRIMIN) 1 % cream   Topical BID Kerry Hough, PA-C      . diphenhydrAMINE (BENADRYL) capsule 50 mg  50 mg Oral Q6H PRN Kerry Hough, PA-C   50 mg at 07/14/16 2208  . diphenhydrAMINE (BENADRYL) capsule 50 mg  50 mg Oral Q6H PRN Craige Cotta, MD   50 mg at 07/17/16 1714   Or  . diphenhydrAMINE (BENADRYL) injection 50 mg  50 mg Intramuscular Q6H PRN Craige Cotta, MD      . guaiFENesin (MUCINEX) 12 hr tablet 600 mg  600 mg Oral BID Kerry Hough, PA-C   600 mg at 07/18/16 2952  . haloperidol (HALDOL) tablet 5 mg  5 mg Oral Q8H PRN Jomarie Longs, MD   5 mg at 07/17/16 2019   Or  . haloperidol lactate (HALDOL) injection 5 mg  5 mg Intramuscular Q8H PRN Saramma Eappen, MD      . LORazepam (ATIVAN) injection 2 mg  2 mg Intramuscular Q6H PRN Craige Cotta, MD       Or  . LORazepam (ATIVAN) tablet 2 mg  2 mg Oral Q6H PRN Craige Cotta, MD   2 mg at 07/17/16 2019  . magnesium hydroxide (MILK OF MAGNESIA) suspension 30 mL  30 mL Oral Daily PRN Charm Rings, NP      . methocarbamol (ROBAXIN) tablet 500 mg  500 mg Oral Q8H PRN Kerry Hough, PA-C      . multivitamin with minerals tablet 1 tablet  1 tablet Oral Daily Kerry Hough, PA-C   1 tablet at 07/18/16 0901  . OLANZapine (ZYPREXA) injection 10 mg  10 mg Intramuscular Once Craige Cotta, MD      . thiamine (B-1) injection 100 mg  100 mg Intramuscular Once [employer], PA-C      . thiamine (VITAMIN B-1) tablet 100 mg  100 mg Oral Daily Kerry Hough, PA-C   100 mg at 07/18/16 0855  . traZODone (DESYREL) tablet 50 mg  50 mg Oral QHS Jomarie Longs, MD   50 mg at 07/17/16 2018    Lab Results: No results found for this or any previous visit (from the past 48 hour(s)).  Blood Alcohol level:  Lab Results  Component Value Date   ETH 122 (H) 07/12/2016   ETH 222 (H) 04/24/2016    Metabolic Disorder Labs: No results  found for: HGBA1C, MPG No results found for: PROLACTIN No results found for: CHOL, TRIG, HDL, CHOLHDL, VLDL, LDLCALC  Physical Findings: AIMS: Facial and Oral Movements  Muscles of Facial Expression: None, normal Lips and Perioral Area: None, normal Jaw: None, normal Tongue: None, normal,Extremity Movements Upper (arms, wrists, hands, fingers): None, normal Lower (legs, knees, ankles, toes): None, normal, Trunk Movements Neck, shoulders, hips: None, normal, Overall Severity Severity of abnormal movements (highest score from questions above): None, normal Incapacitation due to abnormal movements: None, normal Patient's awareness of abnormal movements (rate only patient's report): No Awareness, Dental Status Current problems with teeth and/or dentures?: No Does patient usually wear dentures?: No  CIWA:  CIWA-Ar Total: 0 COWS:  COWS Total Score: 2  Musculoskeletal: Strength & Muscle Tone: within normal limits Gait & Station: normal Patient leans: N/A  Psychiatric Specialty Exam:   Physical Exam  Nursing note and vitals reviewed.   Review of Systems  Cardiovascular: Negative for chest pain.  Gastrointestinal: Negative for nausea.    Blood pressure (!) 152/89, pulse 98, temperature 98.6 F (37 C), resp. rate 18, SpO2 100 %.There is no height or weight on file to calculate BMI.  General Appearance: Disheveled  Eye Contact:  Fair  Speech:  Slow  Volume:  Decreased  Mood:  Irritable   Affect:  cogruent  Thought Process:  Goal Directed  Orientation:  Full (Time, Place, and Person)  Thought Content:  Negative  Suicidal Thoughts:  No  Homicidal Thoughts:  Yes.  without intent/plan  Memory:  Immediate;   Fair  Judgement:  Poor  Insight:  Lacking  Psychomotor Activity:  Decreased  Concentration:  Concentration: Fair  Recall:  [employer] of Knowledge:  Fair  Language:  Fair  Akathisia:  No  Handed:  Right  AIMS (if indicated):     Assets:  Desire for Improvement  ADL's:   Intact  Cognition:  WNL  Sleep:  Number of Hours: 5.25    Treatment Plan Summary: Daily contact with patient to assess and evaluate symptoms and progress in treatment, Medication management and Plan Patient is seen and examined.   Continues to be on forced meds. Continue treatment plan with thorazaine, tegretol and zyprexa. Agitation/psychosis:   Thorazine 50 mg TID and  Thorazine 50 mg po/IM q 6 hrs prn Mood stabilization:  Tegretol XR 200 mg BID Haldol prn, Ativan prn and Trazodone insomnia.  Dispo:   Attempts to have the patient transferred to [facility] given his violence and the potential for violence that he has exhibited.  Thresa Ross, MD  07/18/2016, 11:10 AM

## 2016-07-18 NOTE — [facility] Group Notes (Signed)
[facility] Group Notes:  (Nursing/Healthy Support System) )  Date:  07/18/2016  Time:  1000 Type of Therapy:  Nurse Education  Participation Level:  Did Not Attend  Summary of Progress/Problems: Pt asleep at this time. Mood remains labile and unpredictable.   Sherryl MangesWesseh, Bryla Burek 07/18/2016, 1000

## 2016-07-18 NOTE — [facility] Group Notes (Signed)
[facility] Group Notes: (Clinical Social Work)   07/18/2016      Type of Therapy:  Group Therapy   Participation Level:  Did Not Attend despite MHT prompting   Ambrose MantleMareida Grossman-Orr, LCSW 07/18/2016, 12:50 PM

## 2016-07-18 NOTE — Progress Notes (Signed)
Per Garrett Arellano, [facility] to be a [facility]  Garrett Arellano, LCSWA Disposition staff 07/18/2016 10:51 AM

## 2016-07-18 NOTE — Progress Notes (Signed)
D: Pt presents irritable and guarded this afternoon. Agitated when approached for noon medications "I don't that, I'm not with drawing or having shakes, Y'all keep dumping shit in me like I'm a dump truck or something; I'm not taking that shit".  A: Continue to encouraged pt to comply with care. Medications offered as prescribed. 1:1 observation continues as ordered, assigned staff in attendance.  R: Pt's mood remains labile / unpredictable at intervals. Pt declined noon medications when offered. Remains safe on unit.

## 2016-07-18 NOTE — Progress Notes (Signed)
1-1 note Garrett Arellano is in bed and in his room at this time and resting comfortably. Respirations are even and unlabored and he does not appear to be in any acute distress. A. 1-1 continues at this time. R. Safety maintained, will continue to monitor.

## 2016-07-18 NOTE — Progress Notes (Signed)
D: Pt visible in hall verbally engaged with staff on initial approach. Denies SI, HI, AVH and pain. Observed to be preoccupied. Continues to be delusional, grandeur in nature. Cooperative with care thus far this AM.  A: Pt encouraged to voice concerns, comply with current treatment regimen. Medications administered as prescribed, effects monitored. Support and availability provided to pt. 1:1 observation maintained. Assigned staff in attendance at all times. R: Pt receptive to care at present. Took his medications without issues.  Verbally redirectable at this time. Remains safe on unit. POC continues for mood stability and safety, pt monitored accordingly.

## 2016-07-18 NOTE — Progress Notes (Signed)
1-1 note Garrett Arellano is in room and in bed and resting comfortably at this time. Respirations even and unlabored and he does not appear to be in any acute distress. A. 1-1 continues at this time. R. Safety maintained, will continue to monitor.

## 2016-07-19 MED ORDER — CHLORPROMAZINE HCL 25 MG/ML IJ SOLN: 25.0000 mg | Freq: Three times a day (TID) | INTRAMUSCULAR | Status: DC | PRN

## 2016-07-19 MED ORDER — ADULT MULTIVITAMIN W/MINERALS CH: 1.0000 | ORAL_TABLET | Freq: Every day | ORAL | Status: DC

## 2016-07-19 MED ORDER — LORAZEPAM 2 MG/ML IJ SOLN: 2.0000 mg | Freq: Four times a day (QID) | INTRAMUSCULAR | 0 refills | Status: DC | PRN

## 2016-07-19 MED ORDER — AMANTADINE HCL 100 MG PO CAPS: 100.0000 mg | ORAL_CAPSULE | Freq: Two times a day (BID) | ORAL | Status: DC

## 2016-07-19 MED ORDER — GUAIFENESIN ER 600 MG PO TB12: 600.0000 mg | ORAL_TABLET | Freq: Two times a day (BID) | ORAL | Status: DC

## 2016-07-19 MED ORDER — TRAZODONE HCL 50 MG PO TABS: 50.0000 mg | ORAL_TABLET | Freq: Every day | ORAL | Status: DC

## 2016-07-19 MED ORDER — CLONIDINE HCL 0.1 MG PO TABS: 0.1000 mg | ORAL_TABLET | Freq: Three times a day (TID) | ORAL | 0 refills | Status: DC

## 2016-07-19 MED ORDER — ACETAMINOPHEN 325 MG PO TABS: 650.0000 mg | ORAL_TABLET | Freq: Four times a day (QID) | ORAL | Status: DC | PRN

## 2016-07-19 MED ORDER — BENZONATATE 200 MG PO CAPS: 200.0000 mg | ORAL_CAPSULE | Freq: Three times a day (TID) | ORAL | 0 refills | Status: DC | PRN

## 2016-07-19 MED ORDER — CLOTRIMAZOLE 1 % EX CREA: TOPICAL_CREAM | Freq: Two times a day (BID) | CUTANEOUS | 0 refills | Status: DC

## 2016-07-19 MED ORDER — DIPHENHYDRAMINE HCL 50 MG/ML IJ SOLN: 50.0000 mg | Freq: Four times a day (QID) | INTRAMUSCULAR | 0 refills | Status: DC | PRN

## 2016-07-19 MED ORDER — CARBAMAZEPINE ER 200 MG PO TB12: 200.0000 mg | ORAL_TABLET | Freq: Two times a day (BID) | ORAL | Status: DC

## 2016-07-19 MED ORDER — MAGNESIUM HYDROXIDE 400 MG/5ML PO SUSP: 30.0000 mL | Freq: Every day | ORAL | 0 refills | Status: DC | PRN

## 2016-07-19 MED ORDER — CHLORPROMAZINE HCL 25 MG PO TABS: 25.0000 mg | ORAL_TABLET | Freq: Three times a day (TID) | ORAL | Status: DC | PRN

## 2016-07-19 MED ORDER — THIAMINE HCL 100 MG PO TABS: 100.0000 mg | ORAL_TABLET | Freq: Every day | ORAL | Status: DC

## 2016-07-19 MED ORDER — CHLORPROMAZINE HCL 50 MG PO TABS: 50.0000 mg | ORAL_TABLET | Freq: Three times a day (TID) | ORAL | Status: DC

## 2016-07-19 MED ORDER — ALUM & MAG HYDROXIDE-SIMETH 200-200-20 MG/5ML PO SUSP: 30.0000 mL | ORAL | 0 refills | Status: DC | PRN

## 2016-07-19 MED ORDER — DIPHENHYDRAMINE HCL 50 MG PO CAPS: 50.0000 mg | ORAL_CAPSULE | Freq: Four times a day (QID) | ORAL | 0 refills | Status: DC | PRN

## 2016-07-19 MED ORDER — LORAZEPAM 2 MG PO TABS: 2.0000 mg | ORAL_TABLET | Freq: Four times a day (QID) | ORAL | 0 refills | Status: DC | PRN

## 2016-07-19 MED ORDER — CHLORPROMAZINE HCL 25 MG/ML IJ SOLN: 25.0000 mg | Freq: Three times a day (TID) | INTRAMUSCULAR | 0 refills | Status: DC | PRN

## 2016-07-19 NOTE — Progress Notes (Signed)
Recreation Therapy Notes  Date: 07/19/16 Time: 1000 Location: 500 Hall Dayroom  Group Topic: Coping Skills  Goal Area(s) Addresses:  Patient will be able to identify positive coping skills. Patient will be able to identify the benefits of positive coping skills. Patient will be able to identify benefits of using coping skills post d/c.  Intervention: OrthoptistWeb design, pencils  Activity: OrthoptistWeb Design.  Patients were given a picture of a blank spider web.  Patients were to pictures themselves in the center of the web and identify the things that brought them into the hospital and write them within the web.  Patients were to then identify coping skills the could use for each of those situations and write them on the outside of the web.  Education: PharmacologistCoping Skills, Building control surveyorDischarge Planning.   Education Outcome: Acknowledges understanding/In group clarification offered/Needs additional education.   Clinical Observations/Feedback: Pt did not attend group.   Caroll RancherMarjette Rydell Wiegel, LRT/CTRS     [name], Reida Hem A 07/19/2016 12:14 PM

## 2016-07-19 NOTE — Progress Notes (Signed)
Nursing 1:1 note D:Pt observed sleeping in bed with eyes open. RR even and unlabored. No distress noted. A: 1:1 observation continues for safety  R: pt remains safe  

## 2016-07-19 NOTE — Progress Notes (Signed)
1:1 Note: Patient maintained on constant supervision for safety.  Patient up and took morning medication before going to bed.  Reports not sleeping all night.  Patient is in bed sleeping .  Breathing is even and unlabored.  Routine safety checks continues.  Patient is safe with supervision.

## 2016-07-19 NOTE — Progress Notes (Signed)
[facility]writer and informed that they have bed for patient and that they need the following clinicals first:  Patient's IVC papers  Discharge summary MAR and vital signs for the past several days Md progress notes  CSW in disposition to gather all requested documentation and [facility] at fax#: [phone]. Nurse secretary Kenney Housemananya was notified.  Melbourne Abtsatia Rochelle Larue, LCSWA Disposition staff 07/19/2016 4:08 PM

## 2016-07-19 NOTE — Progress Notes (Signed)
[facility] Post 1:1 Observation Documentation  For the first (8) hours following discontinuation of 1:1 precautions, a progress note entry by nursing staff should be documented at least every 2 hours, reflecting the patient's behavior, condition, mood, and conversation.  Use the progress notes for additional entries.  Time 1:1 discontinued:  11:45AM  Patient's Behavior: Patient is up and  in the dayroom interacting with peers.  Patient is hyper verbal at times.  Medication given without difficulty.  Patient's Condition:  Patient is alert and awake.  No complaint noted or reported.  Patient's Conversation: Patient is seen interacting with staff and peers.     Garrett Arellano 07/19/2016, 6:56 PM

## 2016-07-19 NOTE — Progress Notes (Signed)
[facility] Post 1:1 Observation Documentation  For the first (8) hours following discontinuation of 1:1 precautions, a progress note entry by nursing staff should be documented at least every 2 hours, reflecting the patient's behavior, condition, mood, and conversation.  Use the progress notes for additional entries.  Time 1:1 discontinued:  11:45 AM  Patient's Behavior:  Patient is awake and in the hallway.  Patient is appropriate to situation.  Patient's Condition:  Patient is alert and oriented  X 4.  No complaint offered.  Patient off the unit for meal without any issue.  Patient's Conversation:  Patient in the hallway interacting with staff and peers.    Mickie Baillizabeth O Iwenekha 07/19/2016, 2:56 PM

## 2016-07-19 NOTE — Progress Notes (Signed)
Nursing 1:1 note D:Pt observed sleeping in bed with eyes closed. RR even and unlabored. No distress noted. A: 1:1 observation continues for safety  R: pt remains safe  

## 2016-07-19 NOTE — [facility] Suicide Risk Assessment (Signed)
[facility]   Principal Problem: Schizoaffective disorder, bipolar type [facility](HCC) Discharge Diagnoses:  Patient Active Problem List   Diagnosis Date Noted  . Alcohol use disorder, severe, dependence (HCC) [F10.20] 07/14/2016  . Cocaine use disorder, severe, dependence (HCC) [F14.20] 07/14/2016  . Cannabis use disorder, severe, dependence (HCC) [F12.20] 07/14/2016  . 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 Alcohol abuse [F10.10] 04/26/2016  . 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 Schizoaffective disorder, bipolar type (HCC) [F25.0] 04/25/2016  . Polysubstance abuse [F19.10] 10/14/2014  . Acute psychosis [F23]   . Suicidal ideation [R45.851] 10/09/2014    Total Time spent with patient: 20 minutes  Musculoskeletal: Strength & Muscle Tone: within normal limits Gait & Station: normal Patient leans: N/A  Psychiatric Specialty Exam: Review of Systems  Psychiatric/Behavioral: Positive for hallucinations and substance abuse. The patient is nervous/anxious.   All other systems reviewed and are negative.   Blood pressure 129/69, pulse 80, temperature 97.9 F (36.6 C), temperature source Oral, resp. rate 20, SpO2 100 %.There is no height or weight on file to calculate BMI.  General Appearance: Guarded  Eye Contact::  Fair  Speech:  Normal Rate409  Volume:  Normal  Mood:  Anxious and Irritable  Affect:  Congruent  Thought Process:  Goal Directed and Descriptions of Associations: Circumstantial  Orientation:  Full (Time, Place, and Person)  Thought Content:  Hallucinations: Auditory and Rumination, unable to elaborate on AH , but states AH makes him angry  Suicidal Thoughts:  did not express any  Homicidal Thoughts:  did not express any , but is irritable and has AH which makes him angry  Memory:  Immediate;   Fair Recent;    Fair Remote;   Fair  Judgement:  Impaired  Insight:  Lacking  Psychomotor Activity:  Restlessness  Concentration:  Fair  Recall:  [employer]Fair  Fund of Knowledge:Fair  Language: Fair  Akathisia:  No  Handed:  Right  AIMS (if indicated):     Assets:  Desire for Improvement  Sleep:  Number of Hours: 5.25  Cognition: WNL  ADL's:  Intact   Mental Status Per Nursing Assessment::   On Admission:  Suicidal ideation indicated by patient  Demographic Factors:  Male  Loss Factors: NA  Historical Factors: Impulsivity  Risk Reduction Factors:   Employed  Continued Clinical Symptoms:  Severe Anxiety and/or Agitation Alcohol/Substance Abuse/Dependencies Currently Psychotic Unstable or Poor Therapeutic Relationship Previous Psychiatric Diagnoses and Treatments Medical Diagnoses and Treatments/Surgeries  Cognitive Features That Contribute To Risk:  Closed-mindedness, Polarized thinking and Thought constriction (tunnel vision)    Suicide Risk:  Mild:  Suicidal ideation of limited frequency, intensity, duration, and specificity.  There are no identifiable plans, no associated intent, mild dysphoria and related symptoms, good self-control (both objective and subjective assessment), few other risk factors, and identifiable protective factors, including available and accessible social support.   Violence risk: Moderate to high: Pt with chronic mental illness with current acute exacerbation , has AH that makes him mad, is irritable , has substance abuse hx , hx of aggression, violence , had several episodes of aggression on the unit , is single .    Plan Of Care/Follow-up recommendations:  Activity:  no restrictions Diet:  regular Tests:  Tegretol level pending for tomorrow AM, he refused previous labs Other:  [facility] due to acuity  Charnika Herbst, MD 07/19/2016, 4:32 PM

## 2016-07-19 NOTE — Plan of Care (Signed)
Problem: Safety: Goal: Periods of time without injury will increase Outcome: Progressing Pt safe on the unit at this time, pt continues on 1:1 for safety

## 2016-07-19 NOTE — Tx Team (Signed)
Interdisciplinary Treatment and Diagnostic Plan Update  07/19/2016 Time of Session: 8:28 AM  Garrett Arellano MRN: [id number]  Principal Diagnosis: Schizoaffective disorder, bipolar type ([place])  Secondary Diagnoses: Principal Problem:   Schizoaffective disorder, bipolar type ([place]) Active Problems:   Alcohol use disorder, severe, dependence ([place])   Cocaine use disorder, severe, dependence (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 acetaminophen (TYLENOL) tablet 650 mg  650 mg Oral Q6H PRN Patrecia Pour, NP      . alum & mag hydroxide-simeth (MAALOX/MYLANTA) 200-200-20 MG/5ML suspension 30 mL  30 mL Oral Q4H PRN Patrecia Pour, NP      . amantadine (SYMMETREL) capsule 100 mg  100 mg Oral BID Ursula Alert, MD   100 mg at 07/19/16 0749  . benzonatate (TESSALON) capsule 200 mg  200 mg Oral TID PRN Laverle Hobby, PA-C   200 mg at 07/18/16 2059  . benztropine (COGENTIN) tablet 1 mg  1 mg Oral Q6H PRN Ursula Alert, MD       Or  . benztropine mesylate (COGENTIN) injection 1 mg  1 mg Intramuscular Q6H PRN Ursula Alert, MD      . carbamazepine (TEGRETOL XR) 12 hr tablet 200 mg  200 mg Oral BID Ursula Alert, MD   200 mg at 07/19/16 0749  . chlorproMAZINE (THORAZINE) injection 50 mg  50 mg Intramuscular Q6H PRN Jenne Campus, MD       Or  . chlorproMAZINE (THORAZINE) tablet 50 mg  50 mg Oral Q6H PRN Jenne Campus, MD   50 mg at 07/16/16 2056  . chlorproMAZINE (THORAZINE) tablet 50 mg  50 mg Oral TID Sharma Covert, MD   50 mg at 07/19/16 0749  . cloNIDine (CATAPRES) tablet 0.1 mg  0.1 mg Oral TID Sharma Covert, MD   0.1 mg at 07/19/16 0749  . clotrimazole (LOTRIMIN) 1 % cream   Topical BID Laverle Hobby, PA-C      . diphenhydrAMINE  (BENADRYL) capsule 50 mg  50 mg Oral Q6H PRN Laverle Hobby, PA-C   50 mg at 07/14/16 2208  . diphenhydrAMINE (BENADRYL) capsule 50 mg  50 mg Oral Q6H PRN Jenne Campus, MD   50 mg at 07/19/16 0749   Or  . diphenhydrAMINE (BENADRYL) injection 50 mg  50 mg Intramuscular Q6H PRN Jenne Campus, MD      . guaiFENesin (MUCINEX) 12 hr tablet 600 mg  600 mg Oral BID Laverle Hobby, PA-C   600 mg at 07/19/16 0749  . haloperidol (HALDOL) tablet 5 mg  5 mg Oral Q8H PRN Ursula Alert, MD   5 mg at 07/17/16 2019   Or  . haloperidol lactate (HALDOL) injection 5 mg  5 mg Intramuscular Q8H PRN Saramma Eappen, MD      . LORazepam (ATIVAN) injection 2 mg  2 mg Intramuscular Q6H PRN Jenne Campus, MD       Or  . LORazepam (ATIVAN) tablet 2 mg  2 mg Oral Q6H PRN Jenne Campus, MD   2 mg at 07/19/16 0748  . magnesium hydroxide (MILK OF MAGNESIA) suspension 30 mL  30 mL Oral Daily PRN Patrecia Pour, NP      . multivitamin with minerals tablet 1 tablet  1 tablet Oral  Daily Laverle Hobby, PA-C   1 tablet at 07/19/16 0749  . OLANZapine (ZYPREXA) injection 10 mg  10 mg Intramuscular Once Jenne Campus, MD      . thiamine (B-1) injection 100 mg  100 mg Intramuscular Once [employer], PA-C      . thiamine (VITAMIN B-1) tablet 100 mg  100 mg Oral Daily Laverle Hobby, PA-C   100 mg at 07/19/16 0749  . traZODone (DESYREL) tablet 50 mg  50 mg Oral QHS Ursula Alert, MD   50 mg at 07/18/16 2059    PTA Medications: Prescriptions Prior to Admission  Medication Sig Dispense Refill Last Dose  . amantadine (SYMMETREL) 100 MG capsule Take 1 capsule (100 mg total) by mouth 2 (two) times daily. 60 capsule 0 Past Month at Unknown time  . hydrOXYzine (ATARAX/VISTARIL) 25 MG tablet Take 1 tablet (25 mg total) by mouth every 6 (six) hours as needed (anxiety/agitation or CIWA < or = 10). 30 tablet 0 Past Month at Unknown time  . risperiDONE (RISPERDAL) 1 MG tablet Take 1 tablet (1 mg total) by mouth 2 (two)  times daily. 60 tablet 0 Past Month at Unknown time  . traZODone (DESYREL) 100 MG tablet Take 1 tablet (100 mg total) by mouth at bedtime. 30 tablet 0 Past Month at Unknown time    Treatment Modalities: Medication Management, Group therapy, Case management,  1 to 1 session with clinician, Psychoeducation, Recreational therapy.   Physician Treatment Plan for Primary Diagnosis: Schizoaffective disorder, bipolar type ([place]) Long Term Goal(s): Improvement in symptoms so as ready for discharge  Short Term Goals: Ability to identify triggers associated with substance abuse/mental health issues will improve  Medication Management: Evaluate patient's response, side effects, and tolerance of medication regimen.  Therapeutic Interventions: 1 to 1 sessions, Unit Group sessions and Medication administration.  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  Therapeutic Interventions: 1 to 1 sessions, Unit Group sessions and Medication administration.  Evaluation of Outcomes: Progressing   RN Treatment Plan for Primary Diagnosis: Schizoaffective disorder, bipolar type ([place]) Long Term Goal(s): Knowledge of disease and therapeutic regimen to maintain health will improve  Short Term Goals: Ability to demonstrate self-control and Compliance with prescribed medications will  improve  Medication Management: RN will administer medications as ordered by provider, will assess and evaluate patient's response and provide education to patient for prescribed medication. RN will report any adverse and/or side effects to prescribing provider.  Therapeutic Interventions: 1 on 1 counseling sessions, Psychoeducation, Medication administration, Evaluate responses to treatment, Monitor vital signs and CBGs as ordered, Perform/monitor CIWA, COWS, AIMS and Fall Risk screenings as ordered, Perform wound care treatments as ordered.  Evaluation of Outcomes: Progressing   LCSW Treatment Plan for Primary Diagnosis: Schizoaffective disorder, bipolar type ([place]) Long Term Goal(s): Safe transition to appropriate next level of care at discharge, Engage patient in therapeutic group addressing interpersonal concerns.  Short Term Goals: Engage patient in aftercare planning with referrals and resources  and Increase skills for wellness and recovery  Therapeutic Interventions: Assess for all discharge needs, 1 to 1 time with Social worker, Explore available resources and support systems, Assess for adequacy in community support network, Educate family and significant other(s) on suicide prevention, Complete Psychosocial Assessment, Interpersonal group therapy.  Evaluation of Outcomes: Met   Progress in Treatment: Attending groups: Yes Participating in groups: Yes Taking medication as prescribed: Yes, MD continues to assess for medication changes as needed Toleration medication: Yes, no side effects reported at this time Family/Significant other contact made: No  Patient understands diagnosis: Limited insight  Discussing patient identified problems/goals with staff: Yes Medical problems stabilized or resolved: Yes Denies suicidal/homicidal ideation: No Issues/concerns per patient self-inventory: None Other: N/A  New problem(s) identified: None identified at this time.   New Short  Term/Long Term Goal(s): None identified at this time.   Discharge Plan or Barriers:  Return home and follow up outpatient   Reason for Continuation of Hospitalization:   Estimated Length of Stay: Transfer to [facility] today  Attendees: Patient: 07/19/2016  8:28 AM  Physician: Dr. Shea Evans 07/19/2016  8:28 AM  Nursing: Darrol Angel, RN  07/19/2016  8:28 AM  RN Care Manager: Lars Pinks 07/19/2016  8:28 AM  Social Worker: Ripley Fraise, [place] 07/19/2016  8:28 AM  Recreational Therapist: Winfield Cunas 07/19/2016  8:28 AM  Other: Radonna Ricker, Social Work Intern  07/19/2016  8:28 AM  Other:  07/19/2016  8:28 AM  Other: 07/19/2016  8:28 AM    Scribe for Treatment Team: Ripley Fraise 07/19/2016 8:28 AM

## 2016-07-19 NOTE — Progress Notes (Signed)
Nursing 1:1 note D:Pt in room RR even and unlabored. No distress noted. A: 1:1 observation continues for safety  R: pt remains safe

## 2016-07-19 NOTE — Progress Notes (Signed)
[facility] Post 1:1 Observation Documentation  For the first (8) hours following discontinuation of 1:1 precautions, a progress note entry by nursing staff should be documented at least every 2 hours, reflecting the patient's behavior, condition, mood, and conversation.  Use the progress notes for additional entries.  Time 1:1 discontinued:  11:45AM  Patient's Behavior:  Patient is in his room sleeping.    Patient's Condition: Patient is in no acute distress.   Patient's Conversation:  No interaction with staff or peers.  Mickie Baillizabeth O Iwenekha 07/19/2016, 5:30 PM

## 2016-07-19 NOTE — Progress Notes (Signed)
[facility] MD Progress Note  07/19/2016 2:58 PM Garrett Arellano  MRN:  [id number] Subjective:  Pt states " I can get mad , I want to get back to work.I hear voices and that is why I get irritable sometimes."  Objective:Patient seen and chart reviewed.Discussed patient with treatment team.  Pt seen in bed. Pt continues to be irritable , and guarded. Pt is preoccupied with discharge. Pt continues to need a lot of redirection on the unit . Pt is on 1:1 precaution , but is seen as being able to follow redirection today , he agrees to follow rules as well as take PO medications. Hence discussed discontinuing 1: 1 precaution since he is more logical today. He is oriented x3. He is on forced medication order - started on 07/15/16 per Dr.Clary/Dr.Cobbos.     Principal Problem: Schizoaffective disorder, bipolar type (HCC) Diagnosis:   Patient Active Problem List   Diagnosis Date Noted  . Alcohol use disorder, severe, dependence (HCC) [F10.20] 07/14/2016  . Cocaine use disorder, severe, dependence (HCC) [F14.20] 07/14/2016  . Cannabis use disorder, severe, dependence (HCC) [F12.20] 07/14/2016  . 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 Alcohol abuse [F10.10] 04/26/2016  . 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 Schizoaffective disorder, bipolar type (HCC) [F25.0] 04/25/2016  . Polysubstance abuse [F19.10] 10/14/2014  . Acute psychosis [F23]   . Suicidal ideation [R45.851] 10/09/2014   Total Time spent with patient: 25 minutes  Past Psychiatric History: see H&P  Past Medical History:  Past Medical History:  Diagnosis Date  . Bipolar 1 disorder (HCC)   . Schizophrenia (HCC)   . Seizure [facility])    History reviewed. No pertinent surgical history. Family History:  Family History  Problem Relation Age of Onset  . Mental illness Maternal Uncle    Family  Psychiatric  History: see H&P Social History:  History  Alcohol Use  . Yes    Comment: unwilling to provide specific details     History  Drug Use  . Types: Marijuana    Comment: unwilling to provide specific details    Social History   Social History  . Marital status: Single    Spouse name: N/A  . Number of children: N/A  . Years of education: N/A   Social History Main Topics  . Smoking status: Current Every Day Smoker    Packs/day: 0.25    Types: Cigarettes  . Smokeless tobacco: Never Used     Comment: pt unable to provide length of use  . Alcohol use Yes     Comment: unwilling to provide specific details  . Drug use: Yes    Types: Marijuana     Comment: unwilling to provide specific details  . Sexual activity: Not Asked   Other Topics Concern  . None   Social History Narrative  . None   Additional Social History:    Pain Medications: denies abuse Prescriptions: denies abuse Over the Counter: denies abuse History of alcohol / drug use?:  (unwilling to provide specifics) Longest period of sobriety (when/how long):  (unwilling to discuss) Negative Consequences of Use:  (unwilling to discuss)                    Sleep: Fair  Appetite:  Fair  Current Medications: Current Facility-Administered Medications  Medication Dose Route Frequency Provider Last Rate Last Dose  . acetaminophen (TYLENOL) tablet 650 mg  650 mg  Oral Q6H PRN Charm Rings, NP      . alum & mag hydroxide-simeth (MAALOX/MYLANTA) 200-200-20 MG/5ML suspension 30 mL  30 mL Oral Q4H PRN Charm Rings, NP      . amantadine (SYMMETREL) capsule 100 mg  100 mg Oral BID Jomarie Longs, MD   100 mg at 07/19/16 0749  . benzonatate (TESSALON) capsule 200 mg  200 mg Oral TID PRN Kerry Hough, PA-C   200 mg at 07/18/16 2059  . carbamazepine (TEGRETOL XR) 12 hr tablet 200 mg  200 mg Oral BID Jomarie Longs, MD   200 mg at 07/19/16 0749  . chlorproMAZINE (THORAZINE) tablet 25 mg  25 mg Oral TID  PRN Jomarie Longs, MD       Or  . chlorproMAZINE (THORAZINE) injection 25 mg  25 mg Intramuscular TID PRN Jomarie Longs, MD      . chlorproMAZINE (THORAZINE) tablet 50 mg  50 mg Oral TID Antonieta Pert, MD   50 mg at 07/19/16 1159  . cloNIDine (CATAPRES) tablet 0.1 mg  0.1 mg Oral TID Antonieta Pert, MD   0.1 mg at 07/19/16 1200  . clotrimazole (LOTRIMIN) 1 % cream   Topical BID Kerry Hough, PA-C      . diphenhydrAMINE (BENADRYL) capsule 50 mg  50 mg Oral Q6H PRN Craige Cotta, MD   50 mg at 07/19/16 0749   Or  . diphenhydrAMINE (BENADRYL) injection 50 mg  50 mg Intramuscular Q6H PRN Craige Cotta, MD      . guaiFENesin (MUCINEX) 12 hr tablet 600 mg  600 mg Oral BID Kerry Hough, PA-C   600 mg at 07/19/16 0749  . LORazepam (ATIVAN) injection 2 mg  2 mg Intramuscular Q6H PRN Craige Cotta, MD       Or  . LORazepam (ATIVAN) tablet 2 mg  2 mg Oral Q6H PRN Craige Cotta, MD   2 mg at 07/19/16 0748  . magnesium hydroxide (MILK OF MAGNESIA) suspension 30 mL  30 mL Oral Daily PRN Charm Rings, NP      . multivitamin with minerals tablet 1 tablet  1 tablet Oral Daily Kerry Hough, PA-C   1 tablet at 07/19/16 0749  . OLANZapine (ZYPREXA) injection 10 mg  10 mg Intramuscular Once Craige Cotta, MD      . thiamine (B-1) injection 100 mg  100 mg Intramuscular Once [employer], PA-C      . thiamine (VITAMIN B-1) tablet 100 mg  100 mg Oral Daily Kerry Hough, PA-C   100 mg at 07/19/16 0749  . traZODone (DESYREL) tablet 50 mg  50 mg Oral QHS Jomarie Longs, MD   50 mg at 07/18/16 2059    Lab Results: No results found for this or any previous visit (from the past 48 hour(s)).  Blood Alcohol level:  Lab Results  Component Value Date   ETH 122 (H) 07/12/2016   ETH 222 (H) 04/24/2016    Metabolic Disorder Labs: No results found for: HGBA1C, MPG No results found for: PROLACTIN No results found for: CHOL, TRIG, HDL, CHOLHDL, VLDL, LDLCALC  Physical  Findings: AIMS: Facial and Oral Movements Muscles of Facial Expression: None, normal Lips and Perioral Area: None, normal Jaw: None, normal Tongue: None, normal,Extremity Movements Upper (arms, wrists, hands, fingers): None, normal Lower (legs, knees, ankles, toes): None, normal, Trunk Movements Neck, shoulders, hips: None, normal, Overall Severity Severity of abnormal movements (highest score from questions  above): None, normal Incapacitation due to abnormal movements: None, normal Patient's awareness of abnormal movements (rate only patient's report): No Awareness, Dental Status Current problems with teeth and/or dentures?: No Does patient usually wear dentures?: No  CIWA:  CIWA-Ar Total: 0 COWS:  COWS Total Score: 1  Musculoskeletal: Strength & Muscle Tone: within normal limits Gait & Station: seen in bed Patient leans: N/A  Psychiatric Specialty Exam:   Physical Exam  Nursing note and vitals reviewed.   Review of Systems  Psychiatric/Behavioral: Positive for substance abuse. The patient is nervous/anxious.   All other systems reviewed and are negative.   Blood pressure 129/69, pulse 80, temperature 97.9 F (36.6 C), temperature source Oral, resp. rate 20, SpO2 100 %.There is no height or weight on file to calculate BMI.  General Appearance: Guarded  Eye Contact:  Fair  Speech:  Normal Rate  Volume:  Normal  Mood:  irritable  Affect:  Easily irritable , but able to be redirected   Thought Process:  Goal Directed and Descriptions of Associations: Circumstantial  Orientation:  Full (Time, Place, and Person)  Thought Content:  Hallucinations: Auditory does not elaborate - but states they are better, reports AH makes him mad , but is better now  Suicidal Thoughts:  No  Homicidal Thoughts:  No  Memory:  Immediate;   Fair Recent;   Fair Remote;   Fair  Judgement:  Poor  Insight:  Lacking  Psychomotor Activity:  Decreased  Concentration:  Concentration: Fair and  Attention Span: Fair  Recall:  F[employer]ervFair  Fund of Knowledge:  Fair  Language:  Fair  Akathisia:  No  Handed:  Right  AIMS (if indicated):     Assets:  Desire for Improvement  ADL's:  Intact  Cognition:  WNL  Sleep:  Number of Hours: 5.25   Schizoaffective disorder, bipolar type (HCC) unstable    Treatment Plan Summary:Patient presented very aggressive, delusional, agitated , has coexisiting substance abuse, pt became disruptive and aggressive on the unit on 07/15/16 - requiring restraints ( both physical and chemical) . Pt also was started on forced medication order per Dr.Clary/Dr.Cobbos the same day.( valid for 7 days from 07/15/16) Per RN - pt has been taking PO medications , is more redirectable , but lacks insight, continues to be preoccupied with discharge . Pt will be taken off of 1:1 precaution today , will readjust medications and continue treatment. Daily contact with patient to assess and evaluate symptoms and progress in treatment, Medication management and Plan see below    Continue Tegretol xr 200 mg po bid for mood sx as well as seizure disorder. Pt is also on seizure precaution. Will continue Thorazine 50 mg po tid for mood sx/psychosis. He has been taking it PO. Reduce Thorazine PRN to 25 mg tid prn/IM/PO for severe anxiety/agitation. Will need to be cautious in increasing antipsychotics in this pt due to his coexisting seizure disorder , they can lower his seizure threshold. Continue Ativan 2 mg PO/IM q6h prn for anxiety/agitation. Trazodone 50 mg po qhs prn for insomnia. Reviewed EKG - today ( 07/19/16) qtc - wnl. Will order Tegretol level again for 07/20/16 AM . [facility] . Continues to be on forced meds.   Davarius Ridener, MD  07/19/2016, 2:58 PM

## 2016-07-19 NOTE — Discharge Summary (Signed)
Physician Discharge Summary Note  Patient:  Garrett Arellano is an 41 y.o., male MRN:  [id number] DOB:  [birth year] Patient phone:  [phone] (home)  Patient address:   [address] [place] [address],  Total Time spent with patient: Greater than 30 minutes  Date of Admission:  07/13/2016 Date of Discharge: 07-19-16  Reason for Admission: Worsening symptoms Schizoaffective disorder, Bipolar-type.  Principal Problem: Schizoaffective disorder, bipolar type [facility])  Discharge Diagnoses: Patient Active Problem List   Diagnosis Date Noted  . Alcohol use disorder, severe, dependence (HCC) [F10.20] 07/14/2016  . Cocaine use disorder, severe, dependence (HCC) [F14.20] 07/14/2016  . Cannabis use disorder, severe, dependence (HCC) [F12.20] 07/14/2016  . 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 Alcohol abuse [F10.10] 04/26/2016  . 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 Schizoaffective disorder, bipolar type (HCC) [F25.0] 04/25/2016  . Polysubstance abuse [F19.10] 10/14/2014  . Acute psychosis [F23]   . Suicidal ideation [R45.851] 10/09/2014   Past Psychiatric History: Hx. Polysubstance use disorder, Schizoaffective disorder, bipolar-type.  Past Medical History:  Past Medical History:  Diagnosis Date  . Bipolar 1 disorder (HCC)   . Schizophrenia (HCC)   . Seizure [facility])    History reviewed. No pertinent surgical history. Family History:  Family History  Problem Relation Age of Onset  . Mental illness Maternal Uncle    Family Psychiatric  History: See H&P Social History:  History  Alcohol Use  . Yes    Comment: unwilling Arellano provide specific details     History  Drug Use  . Types: Marijuana    Comment: unwilling Arellano provide specific details    Social History   Social History  . Marital status: Single    Spouse name: N/A  .  Number of children: N/A  . Years of education: N/A   Social History Main Topics  . Smoking status: Current Every Day Smoker    Packs/day: 0.25    Types: Cigarettes  . Smokeless tobacco: Never Used     Comment: pt unable Arellano provide length of use  . Alcohol use Yes     Comment: unwilling Arellano provide specific details  . Drug use: Yes    Types: Marijuana     Comment: unwilling Arellano provide specific details  . Sexual activity: Not Asked   Other Topics Concern  . None   Social History Narrative  . None   Hospital Course: Garrett Arellano is a 41 year old single male , employed, lives with sister in [place], [place] , has a hx of schizoaffective disoider, presented  aggressively Arellano [facility] as he was being brought in by the [employer].  Pt was acting bizarre, vandalizing the ED department aggressively, reported he was the demon and drinks blood. Pt also threatened Arellano hang himself in ED. Pt also threatened Arellano electrocute himself while in the ED or swallow a razor blade. Pt after being transferred Arellano [facility] became aggressive, broke a door window glass with his fist. Pt per report did not sustain any injuries . Pt was seen by night shift provider and was started on CIWA/Ativan protocol for possible agitation 2/2 substance withdrawal symptoms as his BAL on admission was 122 & UDS positive for Cocaine & THC.  Since his arrival Arellano the [facility] adult unit & with initiation of medication regimen for his presenting symptoms, Garrett Arellano's symptoms has failed Arellano respond Arellano his treatment regimen. This  is evidenced by his continued  aggressiveness, irritability, agitations, restlessness & often guarded. He was kept on 1:1 supervision for his safety & the safety of the staff. He often refused his treatments including lab work. He is usually seen pre-occupied in his own world & reports hearing angry voices that make him irritable. Staff reports periods of grandiosity on the part of the patient &  continued Arellano provide redirection Arellano reality.  On Thursday, 07-15-16, patient was observed increasingly irritable, loud & destructive as he was observed breaking property on the unit, not responding Arellano chemical restraints. He was required Arellano be in a locked seclusion for safety. Yet, he was observed banging his head on the window hence, required 4 points restraints with ongoing supervision, assessments & support. No injuries sustained from the restraints.   Garrett Priestito is currently on forced medications with slight improvement noted, however, he remains irritable, aggressive & labile. And because [facility] Arellano meet his mental health care needs at this time, Garrett Priestito [facility]. Garrett Arellano presented with pre-existing medical history of seizure disorder & on Tegretol. Tegretol level pending for tomorrow morning 07-20-16. He refused previous labs orders from this hospital. [facility].  Physical Findings: AIMS: Facial and Oral Movements Muscles of Facial Expression: None, normal Lips and Perioral Area: None, normal Jaw: None, normal Tongue: None, normal,Extremity Movements Upper (arms, wrists, hands, fingers): None, normal Lower (legs, knees, ankles, toes): None, normal, Trunk Movements Neck, shoulders, hips: None, normal, Overall Severity Severity of abnormal movements (highest score from questions above): None, normal Incapacitation due Arellano abnormal movements: None, normal Patient's awareness of abnormal movements (rate only patient's report): No Awareness, Dental Status Current problems with teeth and/or dentures?: No Does patient usually wear dentures?: No  CIWA:  CIWA-Ar Total: 0 COWS:  COWS Total Score: 1  Musculoskeletal: Strength & Muscle Tone: within normal limits Gait & Station: normal Patient leans: N/A  Psychiatric Specialty Exam: Physical Exam  Constitutional: He appears well-developed.  HENT:  Head:  Normocephalic.  Eyes: Pupils are equal, round, and reactive Arellano light.  Neck: Normal range of motion.  Cardiovascular: Normal rate.   Respiratory: Effort normal.  GI: Soft.  Genitourinary:  Genitourinary Comments: Deferred  Musculoskeletal: Normal range of motion.  Neurological: He is alert.  Skin: Skin is warm.    Review of Systems  Constitutional: Negative.   HENT: Negative.   Eyes: Negative.   Respiratory: Negative.   Cardiovascular: Negative.   Gastrointestinal: Negative.   Genitourinary: Negative.   Musculoskeletal: Negative.   Skin: Negative.   Neurological: Negative.   Endo/Heme/Allergies: Negative.   Psychiatric/Behavioral: Positive for depression (Worsening), hallucinations (Hx. Psychosis), substance abuse (Polysubstance dependence) and suicidal ideas. Negative for memory loss. The patient is nervous/anxious (Worsening) and has insomnia (Worsening).     Blood pressure 129/69, pulse 80, temperature 97.9 F (36.6 C), temperature source Oral, resp. rate 20, SpO2 100 %.There is no height or weight on file Arellano calculate BMI.  See Md's SRA   Have you used any form of tobacco in the last 30 days? (Cigarettes, Smokeless Tobacco, Cigars, and/or Pipes): Yes  Has this patient used any form of tobacco in the last 30 days? (Cigarettes, Smokeless Tobacco, Cigars, and/or Pipes): No  Blood Alcohol level:  Lab Results  Component Value Date   ETH 122 (H) 07/12/2016   ETH 222 (H) 04/24/2016   Metabolic Disorder Labs:  No results found for: HGBA1C, MPG No results  found for: PROLACTIN No results found for: CHOL, TRIG, HDL, CHOLHDL, VLDL, LDLCALC  See Psychiatric Specialty Exam and Suicide Risk Assessment completed by Attending Physician prior Arellano discharge.  [facility]  Is patient on multiple antipsychotic therapies at discharge:  No   Has Patient had three or more failed trials of antipsychotic monotherapy by history:  No  Recommended Plan for  Multiple Antipsychotic Therapies: NA  Allergies as of 07/19/2016      Reactions   Penicillins Other (See Comments)   Ibuprofen Swelling      Medication List    STOP taking these medications   hydrOXYzine 25 MG tablet Commonly known as:  ATARAX/VISTARIL   risperiDONE 1 MG tablet Commonly known as:  RISPERDAL     TAKE these medications     Indication  acetaminophen 325 MG tablet Commonly known as:  TYLENOL Take 2 tablets (650 mg total) by mouth every 6 (six) hours as needed for mild pain.  Indication:  Fever, Pain   alum & mag hydroxide-simeth 200-200-20 MG/5ML suspension Commonly known as:  MAALOX/MYLANTA Take 30 mLs by mouth every 4 (four) hours as needed for indigestion.  Indication:  Acid Indigestion   amantadine 100 MG capsule Commonly known as:  SYMMETREL Take 1 capsule (100 mg total) by mouth 2 (two) times daily. For EPS What changed:  additional instructions  Indication:  Extrapyramidal Reaction caused by Medications   benzonatate 200 MG capsule Commonly known as:  TESSALON Take 1 capsule (200 mg total) by mouth 3 (three) times daily as needed for cough.  Indication:  Cough   carbamazepine 200 MG 12 hr tablet Commonly known as:  TEGRETOL XR Take 1 tablet (200 mg total) by mouth 2 (two) times daily. For mood stabilization  Indication:  Mood stabilization   chlorproMAZINE 25 MG tablet Commonly known as:  THORAZINE Take 1 tablet (25 mg total) by mouth 3 (three) times daily as needed (severe anxiety/agitation).  Indication:  Severe agitation   chlorproMAZINE 25 MG/ML injection Commonly known as:  THORAZINE Inject 1 mL (25 mg total) into the muscle 3 (three) times daily as needed (severe anxiety/agitation).  Indication:  Severe anxiety/agitation   chlorproMAZINE 50 MG tablet Commonly known as:  THORAZINE Take 1 tablet (50 mg total) by mouth 3 (three) times daily. For mood control  Indication:  Mood control   cloNIDine 0.1 MG tablet Commonly known as:   CATAPRES Take 1 tablet (0.1 mg total) by mouth 3 (three) times daily. For high blood pressure  Indication:  High Blood Pressure Disorder   clotrimazole 1 % cream Commonly known as:  LOTRIMIN Apply topically 2 (two) times daily. For skin rash  Indication:  Skin rash   diphenhydrAMINE 50 MG capsule Commonly known as:  BENADRYL Take 1 capsule (50 mg total) by mouth every 6 (six) hours as needed (AGITATION/EPS PREVENTION).  Indication:  Extrapyramidal Reaction   diphenhydrAMINE 50 MG/ML injection Commonly known as:  BENADRYL Inject 1 mL (50 mg total) into the muscle every 6 (six) hours as needed (EPS PREVENTION.).  Indication:  Extrapyramidal Reaction   guaiFENesin 600 MG 12 hr tablet Commonly known as:  MUCINEX Take 1 tablet (600 mg total) by mouth 2 (two) times daily. For cough  Indication:  Cough   LORazepam 2 MG/ML injection Commonly known as:  ATIVAN Inject 1 mL (2 mg total) into the muscle every 6 (six) hours as needed (AGITATION/ANXIETY).  Indication:  Severe anxiety   LORazepam 2 MG tablet Commonly  known as:  ATIVAN Take 1 tablet (2 mg total) by mouth every 6 (six) hours as needed for anxiety (anxiety/agitation.).  Indication:  Severe anxiety   magnesium hydroxide 400 MG/5ML suspension Commonly known as:  MILK OF MAGNESIA Take 30 mLs by mouth daily as needed for mild constipation.  Indication:  Constipation   multivitamin with minerals Tabs tablet Take 1 tablet by mouth daily. Vitamin supplement Start taking on:  07/20/2016  Indication:  Vitamin supplement   thiamine 100 MG tablet Take 1 tablet (100 mg total) by mouth daily. For low thiamine Start taking on:  07/20/2016  Indication:  Deficiency in Thiamine or Vitamin B1   traZODone 50 MG tablet Commonly known as:  DESYREL Take 1 tablet (50 mg total) by mouth at bedtime. For sleep What changed:  medication strength  how much Arellano take  additional instructions  Indication:  Trouble Sleeping      Follow-up  recommendations: Per [facility] physicians.  Comments: Patient is currently being transferred Arellano the [facility] in [place], [place].  Signed: Sanjuana Kava, NP, PMHNP, FNP-BC 07/19/2016, 4:37 PM

## 2016-07-19 NOTE — Progress Notes (Signed)
The following clinicals [facility] to Kirsten's request: Doctor's second opinion (IVC papers) MAR and vital signs for the past several days Md progress notes for the past several days  LCSW Rod aware of the request. Kirsten received the paperwork and informed writer that she will calling CSW back to advise if any additional papers are needed.  CSW awaiting call from [facility].  Mel[place] Abtsatia Riyaan Heroux, LCSWA Disposition staff 07/19/2016 4:47 PM

## 2016-07-19 NOTE — [facility] Group Notes (Signed)
[facility] LCSW Group Therapy  07/19/2016 2:56 PM   Type of Therapy:  Group Therapy  Participation Level:  Active  Participation Quality:  Attentive  Affect:  Appropriate  Cognitive:  Appropriate  Insight:  Improving  Engagement in Therapy:  Engaged  Modes of Intervention:  Clarification, Education, Exploration and Socialization  Summary of Progress/Problems: Today's group focused on relapse prevention.  We defined the term, and then brainstormed on ways to prevent relapse. Invited.  Chose to not attend.  Daryel Geraldorth, Gelena Klosinski B 07/19/2016 , 2:56 PM

## 2016-07-19 NOTE — Progress Notes (Signed)
D: Pt denies SI/HI/AHV. Pt is pleasant and cooperative. Pt continues to be labile, but is very redirectable. Pt visible on the unit and very talkative.   A: Pt was offered support and encouragement. Pt was given scheduled medications. Pt was encourage to attend groups. Q 15 minute checks were done for safety.    R:Pt attends groups and interacts well with peers and staff. Pt is taking medication. Pt has no complaints.Pt receptive to treatment and safety maintained on unit.

## 2016-07-19 NOTE — Progress Notes (Signed)
Writer [facility] and [facility] Accepting provider Rosemary HolmsKirsten Lopez. Please fax the discharge summary to fax#: [phone] and then call report at [phone]. [name] RN Nurse Secretary and LCSW Daryel Geraldodney North have been informed.  Garrett Arellano, LCSWA Disposition staff 07/19/2016 5:05 PM

## 2016-07-19 NOTE — Progress Notes (Signed)
Pt denies SI/HI/AVH. Pt continues to be labile, but pt has been redirectable on the unit at this time. Pt continues to be on 1:1 for safety. Pt took medications without incident this evening.

## 2016-07-20 NOTE — B[facility] Counselor (Signed)
Say an entry in chart yesterday PM from Cattia in TTS who had received a call about a bed for patient.  She and I coordinated to send them information they requested.  When I called for transportation, [facility] stated they would be unable to transport yesterday, but would be here this AM to transport.  All appropriate staff informed.

## 2016-07-20 NOTE — Progress Notes (Signed)
Report called to Vivien RossettiBarbara Davis RN

## 2016-07-20 NOTE — Progress Notes (Signed)
Pt's mood is irritable and labile this morning. Pt asked for medications to be brought to his room. Writer took medications to his room as requested and pt questioned each medication studying the shape of each pill. Pt refused to have his vitals taken. Clonidine was given as pt had no symptoms of low blood pressure and no hx. Pt called Clinical research associatewriter and [facility] a "f--- bitch" and writer was demanded to get out of pt's room after he reluctantly took his medications.

## 2016-07-20 NOTE — Progress Notes (Signed)
[facility] All items returned. Paper work given to transport and [facility].

## 2016-07-20 NOTE — Progress Notes (Addendum)
  [facility] :  Will you be returning to the same living situation after discharge:  No. [facility] At discharge, do you have transportation home?: Yes,  sheriff Do you have the ability to pay for your medications: Yes,  will be provided by host hospital  Release of information consent forms completed and in the chart;  Patient's signature needed at discharge.  Patient to Follow up at: Follow-up Information    [facility].   Why:  Sheriff will arrive around 9AM today [facility]       [facility].   Why:  You will be transported via sheriff [facility].   Contact information: [address] [place] [id number] [phone]           Next level of care provider [facility] Link:no  Safety Planning and Suicide Prevention discussed: No.  Have you used any form of tobacco in the last 30 days? (Cigarettes, Smokeless Tobacco, Cigars, and/or Pipes): Yes  Has patient been referred to the Quitline?: Patient refused referral  Patient has been referred for addiction treatment: Pt. refused referral  Garrett Arellano 07/21/2016, 12:25 PM

## 2016-07-20 NOTE — Plan of Care (Signed)
[facility] The[facility] Goal: STG-Patient will attend/participate in Rec Therapy Group Ses STG-The Patient will attend and participate in Recreation Therapy Group Sessions  Outcome: Adequate for Discharge Pt attended one recreation therapy session.  Garrett RancherMarjette Deshunda Thackston, LRT/CTRS

## 2016-07-20 NOTE — Progress Notes (Signed)
[facility] Post 1:1 Observation Documentation  For the first (8) hours following discontinuation of 1:1 precautions, a progress note entry by nursing staff should be documented at least every 2 hours, reflecting the patient's behavior, condition, mood, and conversation.  Use the progress notes for additional entries.  Time 1:1 discontinued:  1145  Patient's Behavior:  Calm cooperative  Patient's Condition:  No distress  Patient's Conversation:  appropriate  Delos Haringhillips, Keilan Nichol A 07/20/2016, 6:43 AM

## 2016-07-26 ENCOUNTER — Inpatient Hospital Stay: Payer: Self-pay

## 2016-07-31 ENCOUNTER — Encounter (HOSPITAL_COMMUNITY): Payer: Self-pay | Admitting: Emergency Medicine

## 2016-07-31 ENCOUNTER — Emergency Department (HOSPITAL_COMMUNITY)
Admission: EM | Admit: 2016-07-31 | Discharge: 2016-08-01 | Disposition: A | Discharge status: Home / Self Care | Payer: Federal, State, Local not specified - Other

## 2016-07-31 DIAGNOSIS — F17210 Nicotine dependence, cigarettes, uncomplicated: Secondary | ICD-10-CM | POA: Insufficient documentation

## 2016-07-31 DIAGNOSIS — F1414 Cocaine abuse with cocaine-induced mood disorder: Secondary | ICD-10-CM | POA: Diagnosis present

## 2016-07-31 DIAGNOSIS — F1910 Other psychoactive substance abuse, uncomplicated: Secondary | ICD-10-CM | POA: Insufficient documentation

## 2016-07-31 DIAGNOSIS — F1010 Alcohol abuse, uncomplicated: Secondary | ICD-10-CM | POA: Insufficient documentation

## 2016-07-31 DIAGNOSIS — Z79899 Other long term (current) drug therapy: Secondary | ICD-10-CM | POA: Insufficient documentation

## 2016-07-31 DIAGNOSIS — R4689 Other symptoms and signs involving appearance and behavior: Secondary | ICD-10-CM

## 2016-07-31 LAB — COMPREHENSIVE METABOLIC PANEL
ALBUMIN: 4.4 g/dL (ref 3.5–5.0)
ALK PHOS: 30 U/L — AB (ref 38–126)
ALT: 23 U/L (ref 17–63)
ANION GAP: 9 (ref 5–15)
AST: 24 U/L (ref 15–41)
BILIRUBIN TOTAL: 0.4 mg/dL (ref 0.3–1.2)
BUN: 9 mg/dL (ref 6–20)
CO2: 26 mmol/L (ref 22–32)
Calcium: 9.4 mg/dL (ref 8.9–10.3)
Chloride: 107 mmol/L (ref 101–111)
Creatinine, Ser: 0.98 mg/dL (ref 0.61–1.24)
GFR calc Af Amer: >60 mL/min (ref >60)
GLUCOSE: 94 mg/dL (ref 65–99)
POTASSIUM: 3.7 mmol/L (ref 3.5–5.1)
Sodium: 142 mmol/L (ref 135–145)
TOTAL PROTEIN: 8.3 g/dL — AB (ref 6.5–8.1)

## 2016-07-31 LAB — ETHANOL: Alcohol, Ethyl (B): 207 mg/dL — ABNORMAL HIGH (ref <5)

## 2016-07-31 LAB — RAPID URINE DRUG SCREEN, HOSP PERFORMED
Amphetamines: NOT DETECTED
BENZODIAZEPINES: NOT DETECTED
Barbiturates: NOT DETECTED
COCAINE: POSITIVE — AB
OPIATES: NOT DETECTED
Tetrahydrocannabinol: POSITIVE — AB

## 2016-07-31 LAB — CBC WITH DIFFERENTIAL/PLATELET
BASOS PCT: 1 %
Basophils Absolute: 0 10*3/uL (ref 0.0–0.1)
Eosinophils Absolute: 0.1 10*3/uL (ref 0.0–0.7)
Eosinophils Relative: 1 %
HCT: 42.6 % (ref 39.0–52.0)
Hemoglobin: 14.3 g/dL (ref 13.0–17.0)
Lymphocytes Relative: 45 %
Lymphs Abs: 3.7 10*3/uL (ref 0.7–4.0)
MCH: 28.3 pg (ref 26.0–34.0)
MCHC: 33.6 g/dL (ref 30.0–36.0)
MCV: 84.2 fL (ref 78.0–100.0)
MONO ABS: 0.5 10*3/uL (ref 0.1–1.0)
MONOS PCT: 6 %
NEUTROS ABS: 3.9 10*3/uL (ref 1.7–7.7)
NEUTROS PCT: 47 %
Platelets: 347 10*3/uL (ref 150–400)
RBC: 5.06 MIL/uL (ref 4.22–5.81)
RDW: 14.3 % (ref 11.5–15.5)
WBC: 8.2 10*3/uL (ref 4.0–10.5)

## 2016-07-31 MED ORDER — ZIPRASIDONE MESYLATE 20 MG IM SOLR
20.0000 mg | Freq: Once | INTRAMUSCULAR | Status: AC
  Administered 2016-07-31: 20 mg via INTRAMUSCULAR

## 2016-07-31 MED ORDER — KETAMINE HCL 50 MG/ML IJ SOLN
500.0000 mg | Freq: Once | INTRAMUSCULAR | Status: AC
  Administered 2016-07-31: 500 mg via INTRAMUSCULAR
  Filled 2016-07-31: qty 10

## 2016-07-31 MED ORDER — GLYCOPYRROLATE 0.2 MG/ML IJ SOLN
0.1000 mg | Freq: Once | INTRAMUSCULAR | Status: AC
  Administered 2016-07-31: 0.1 mg via INTRAVENOUS
  Filled 2016-07-31: qty 0.5

## 2016-07-31 NOTE — ED Provider Notes (Signed)
[facility]-EMERGENCY DEPT Provider Note   CSN: [id number] Arrival date & time: 07/31/16  1438     History   Chief Complaint No chief complaint on file.   HPI ZOHAIB HEENEY is a 41 y.o. male.  The history is provided by the patient.  Mental Health Problem  Presenting symptoms: aggressive behavior, agitation, bizarre behavior, disorganized speech, disorganized thought process, homicidal ideas and suicidal threats   Patient accompanied by:  Law enforcement Degree of incapacity (severity):  Severe Onset quality:  Sudden Duration:  1 hour Timing:  Constant Progression:  Unchanged Chronicity:  New Context: alcohol use   Treatment compliance:  Unable to specify Relieved by:  Nothing Exacerbated by: interactions with police. Ineffective treatments:  None tried Associated symptoms: irritability and poor judgment   Risk factors: hx of mental illness and recent psychiatric admission     Past Medical History:  Diagnosis Date  . Bipolar 1 disorder (HCC)   . Schizophrenia (HCC)   . Seizure [facility])     Patient Active Problem List   Diagnosis Date Noted  . Alcohol use disorder, severe, dependence (HCC) 07/14/2016  . Cocaine use disorder, severe, dependence (HCC) 07/14/2016  . Cannabis use disorder, severe, dependence (HCC) 07/14/2016  . 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 Alcohol abuse 04/26/2016  . 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 Schizoaffective disorder, bipolar type (HCC) 04/25/2016  . Polysubstance abuse 10/14/2014  . Acute psychosis   . Suicidal ideation 10/09/2014    No past surgical history on file.     Home Medications    Prior to Admission medications   Medication Sig Start Date End Date Taking? Authorizing Provider  acetaminophen (TYLENOL) 325 MG tablet Take 2 tablets (650 mg total) by mouth every 6 (six) hours as needed for mild  pain. 07/19/16   Sanjuana Kava, NP  alum & mag hydroxide-simeth (MAALOX/MYLANTA) 200-200-20 MG/5ML suspension Take 30 mLs by mouth every 4 (four) hours as needed for indigestion. 07/19/16   Sanjuana Kava, NP  amantadine (SYMMETREL) 100 MG capsule Take 1 capsule (100 mg total) by mouth 2 (two) times daily. For EPS 07/19/16   Sanjuana Kava, NP  benzonatate (TESSALON) 200 MG capsule Take 1 capsule (200 mg total) by mouth 3 (three) times daily as needed for cough. 07/19/16   Sanjuana Kava, NP  carbamazepine (TEGRETOL XR) 200 MG 12 hr tablet Take 1 tablet (200 mg total) by mouth 2 (two) times daily. For mood stabilization 07/19/16   Sanjuana Kava, NP  chlorproMAZINE (THORAZINE) 25 MG tablet Take 1 tablet (25 mg total) by mouth 3 (three) times daily as needed (severe anxiety/agitation). 07/19/16   Sanjuana Kava, NP  chlorproMAZINE (THORAZINE) 25 MG/ML injection Inject 1 mL (25 mg total) into the muscle 3 (three) times daily as needed (severe anxiety/agitation). 07/19/16   Sanjuana Kava, NP  chlorproMAZINE (THORAZINE) 50 MG tablet Take 1 tablet (50 mg total) by mouth 3 (three) times daily. For mood control 07/19/16   Sanjuana Kava, NP  cloNIDine (CATAPRES) 0.1 MG tablet Take 1 tablet (0.1 mg total) by mouth 3 (three) times daily. For high blood pressure 07/19/16   Sanjuana Kava, NP  clotrimazole (LOTRIMIN) 1 % cream Apply topically 2 (two) times daily. For skin rash 07/19/16   Sanjuana Kava, NP  diphenhydrAMINE (BENADRYL) 50 MG capsule Take 1 capsule (50 mg total) by mouth every 6 (six) hours as  needed (AGITATION/EPS PREVENTION). 07/19/16   Sanjuana Kava, NP  diphenhydrAMINE (BENADRYL) 50 MG/ML injection Inject 1 mL (50 mg total) into the muscle every 6 (six) hours as needed (EPS PREVENTION.). 07/19/16   Sanjuana Kava, NP  guaiFENesin (MUCINEX) 600 MG 12 hr tablet Take 1 tablet (600 mg total) by mouth 2 (two) times daily. For cough 07/19/16   Sanjuana Kava, NP  LORazepam (ATIVAN) 2 MG tablet Take 1 tablet (2 mg total) by  mouth every 6 (six) hours as needed for anxiety (anxiety/agitation.). 07/19/16   Sanjuana Kava, NP  LORazepam (ATIVAN) 2 MG/ML injection Inject 1 mL (2 mg total) into the muscle every 6 (six) hours as needed (AGITATION/ANXIETY). 07/19/16   Sanjuana Kava, NP  magnesium hydroxide (MILK OF MAGNESIA) 400 MG/5ML suspension Take 30 mLs by mouth daily as needed for mild constipation. 07/19/16   Sanjuana Kava, NP  Multiple Vitamin (MULTIVITAMIN WITH MINERALS) TABS tablet Take 1 tablet by mouth daily. Vitamin supplement 07/20/16   Sanjuana Kava, NP  thiamine 100 MG tablet Take 1 tablet (100 mg total) by mouth daily. For low thiamine 07/20/16   Sanjuana Kava, NP  traZODone (DESYREL) 50 MG tablet Take 1 tablet (50 mg total) by mouth at bedtime. For sleep 07/19/16   Sanjuana Kava, NP    Family History Family History  Problem Relation Age of Onset  . Mental illness Maternal Uncle     Social History Social History  Substance Use Topics  . Smoking status: Current Every Day Smoker    Packs/day: 0.25    Types: Cigarettes  . Smokeless tobacco: Never Used     Comment: pt unable to provide length of use  . Alcohol use Yes     Comment: unwilling to provide specific details     Allergies   Penicillins and Ibuprofen   Review of Systems Review of Systems  Constitutional: Positive for irritability.  Psychiatric/Behavioral: Positive for agitation and homicidal ideas.  All other systems reviewed and are negative.    Physical Exam Updated Vital Signs There were no vitals taken for this visit.  Physical Exam  Constitutional: He appears well-developed and well-nourished.  Non-cooperative, spitting, yelling incoherently, cursing and flailing in hogtie cuffs applied by police  HENT:  Head: Normocephalic and atraumatic.  Nose: Nose normal.  Eyes: Conjunctivae are normal.  Neck: Neck supple. No tracheal deviation present.  Cardiovascular: Regular rhythm.  Tachycardia present.   Pulmonary/Chest: Effort  normal and breath sounds normal. No respiratory distress.  Abdominal: Soft. He exhibits no distension.  Neurological: He is disoriented. GCS eye subscore is 4. GCS verbal subscore is 4. GCS motor subscore is 6.  Skin: Skin is warm and dry.  Psychiatric: His affect is angry and blunt. His speech is tangential and slurred. He is agitated, aggressive, hyperactive and combative. Cognition and memory are impaired. He expresses impulsivity and inappropriate judgment. He expresses homicidal and suicidal ideation. He is inattentive.     ED Treatments / Results  Labs (all labs ordered are listed, but only abnormal results are displayed) Labs Reviewed  COMPREHENSIVE METABOLIC PANEL - Abnormal; Notable for the following:       Result Value   Total Protein 8.3 (*)    Alkaline Phosphatase 30 (*)    All other components within normal limits  ETHANOL - Abnormal; Notable for the following:    Alcohol, Ethyl (B) 207 (*)    All other components within normal limits  CBC WITH DIFFERENTIAL/PLATELET  RAPID URINE DRUG SCREEN, HOSP PERFORMED    EKG  EKG Interpretation None       Radiology No results found.  Procedures Procedures (including critical care time)  Medications Ordered in ED Medications  glycopyrrolate (ROBINUL) injection 0.1 mg (not administered)  ketamine (KETALAR) injection 500 mg (500 mg Intramuscular Given 07/31/16 1515)  ziprasidone (GEODON) injection 20 mg (20 mg Intramuscular Given 07/31/16 1458)     Initial Impression / Assessment and Plan / ED Course  I have reviewed the triage vital signs and the nursing notes.  Pertinent labs & imaging results that were available during my care of the patient were reviewed by me and considered in my medical decision making (see chart for details).     41 y.o. male presents with Agitated delirium where he got into a confrontation with police after being confronted for shoplifting and written a ticket. He threatened to kill all of the  police officers on scene as well as all of the people in the store and then commit suicide. He has history of schizophrenia, aggressive behavior, and [facility]. Patient is uncooperative, combative, spitting and screaming at staff cursing repetitively and is unable to safely be examined or disrobed. He was placed under IVC by police who he threatened. He was given ketamine to treat as excited delirium and dissociated appropriately. He had an increased amount of secretions after initial dose and was given a dose of Robinul to help dry him out. A dose of Geodon was also provided to prevent emergence.  It appears patient is intoxicated with alcohol level of 207. Plan will be for monitoring to clinical sobriety until he is able to interact with TTS staff to determine appropriate disposition.  Final Clinical Impressions(s) / ED Diagnoses   Final diagnoses:  Aggressive behavior  Involuntary commitment    New Prescriptions New Prescriptions   No medications on file     Lyndal Pulley, MD 07/31/16 [phone]

## 2016-07-31 NOTE — ED Notes (Signed)
Bed: WA29 Expected date:  Expected time:  Means of arrival:  Comments: IVC with [employer]

## 2016-07-31 NOTE — ED Notes (Signed)
Patient having difficulty breathing, increased mucous production. Dr Clydene Pugh called to bedside. Charge nurse called. Nasalpharyngeal airway placed. Robinul given.

## 2016-07-31 NOTE — ED Triage Notes (Signed)
Patient picked up off the street with complaints of homicidal ideation. States that patient was arrest for stealing out of a local store. When placed in cuffs patient reported that he was going to kill self and others including the officer.

## 2016-08-01 DIAGNOSIS — Z79899 Other long term (current) drug therapy: Secondary | ICD-10-CM

## 2016-08-01 DIAGNOSIS — F1414 Cocaine abuse with cocaine-induced mood disorder: Secondary | ICD-10-CM | POA: Diagnosis present

## 2016-08-01 DIAGNOSIS — R4589 Other symptoms and signs involving emotional state: Secondary | ICD-10-CM

## 2016-08-01 DIAGNOSIS — F17210 Nicotine dependence, cigarettes, uncomplicated: Secondary | ICD-10-CM

## 2016-08-01 DIAGNOSIS — Z818 Family history of other mental and behavioral disorders: Secondary | ICD-10-CM

## 2016-08-01 DIAGNOSIS — F1290 Cannabis use, unspecified, uncomplicated: Secondary | ICD-10-CM | POA: Diagnosis not present

## 2016-08-01 DIAGNOSIS — F1010 Alcohol abuse, uncomplicated: Secondary | ICD-10-CM

## 2016-08-01 DIAGNOSIS — R4689 Other symptoms and signs involving appearance and behavior: Secondary | ICD-10-CM | POA: Insufficient documentation

## 2016-08-01 MED ORDER — ZIPRASIDONE MESYLATE 20 MG IM SOLR
INTRAMUSCULAR | Status: AC
  Administered 2016-08-01: 08:00:00
  Filled 2016-08-01: qty 20

## 2016-08-01 MED ORDER — ZIPRASIDONE MESYLATE 20 MG IM SOLR
20.0000 mg | Freq: Once | INTRAMUSCULAR | Status: AC | PRN
  Administered 2016-08-01: 20 mg via INTRAMUSCULAR

## 2016-08-01 MED ORDER — LORAZEPAM 1 MG PO TABS: 0.0000 mg | ORAL_TABLET | Freq: Four times a day (QID) | ORAL | Status: DC

## 2016-08-01 MED ORDER — STERILE WATER FOR INJECTION IJ SOLN
INTRAMUSCULAR | Status: AC
  Administered 2016-08-01: 08:00:00
  Filled 2016-08-01: qty 10

## 2016-08-01 MED ORDER — LORAZEPAM 1 MG PO TABS: 0.0000 mg | ORAL_TABLET | Freq: Two times a day (BID) | ORAL | Status: DC

## 2016-08-01 NOTE — ED Notes (Signed)
Pt alert and oriented x 4, pt is having conversation with another pt, sts that that is his cousin . The other pt confirmed her relationship with Mr. Spraggins.

## 2016-08-01 NOTE — Consult Note (Signed)
[facility] Face-to-Face Psychiatry Consult   Reason for Consult:  Cocaine and alcohol abuse with agitation Referring Physician:  EDP Patient Identification: DAWUD MAYS MRN:  [id number] Principal Diagnosis: Cocaine abuse with cocaine-induced mood disorder [facility]) Diagnosis:   Patient Active Problem List   Diagnosis Date Noted  . Cocaine abuse with cocaine-induced mood disorder ([place]) [F14.14] 08/01/2016    Priority: High  . Alcohol abuse [F10.10] 04/26/2016    Priority: High  . 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 Alcohol use disorder, severe, dependence ([place]) [F10.20] 07/14/2016  . Cocaine use disorder, severe, dependence ([place]) [F14.20] 07/14/2016  . Cannabis use disorder, severe, dependence ([place]) [F12.20] 07/14/2016  . 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 Schizoaffective disorder, bipolar type ([place]) [F25.0] 04/25/2016  . Polysubstance abuse [F19.10] 10/14/2014  . Acute psychosis [F23]   . Suicidal ideation [R45.851] 10/09/2014    Total Time spent with patient: 45 minutes  Subjective:   AITHAN FARRELLY is a 41 y.o. male patient does not warrant admission.  HPI:  41 yo male who presented to the ED under IVC after being arrested for shoplifting at [facility].  In the process of being arrested, he threatened to kill the police and everyone else, under the influence of alcohol and cocaine.  Caveat:  Young policemen.  He was highly agitated and was given PRN medications.  Today, he is clear and coherent, no psychosis.  Denies suicidal/homicidal ideations or withdrawal symptoms.  Isack was masturbating earlier this morning in his room.  He wants to discharge quickly to get to church.  Matilde was recently at [facility] and the paperwork obtained.  They kept him 48 hours and released him as they report he has no psychiatric diagnosis to keep him there.   In 2013, he was there and they discharged him to jail.  Stable for discharge.  Past Psychiatric History: aggression  Risk to Self: No Risk to Others:  No Prior Inpatient Therapy:  [facility], [place] Prior Outpatient Therapy:  yes   Past Medical History:  Past Medical History:  Diagnosis Date  . Bipolar 1 disorder ([place])   . Schizophrenia ([place])   . Seizure [facility])    History reviewed. No pertinent surgical history. Family History:  Family History  Problem Relation Age of Onset  . Mental illness Maternal Uncle    Family Psychiatric  History: unknown Social History:  History  Alcohol Use  . Yes    Comment: unwilling to provide specific details     History  Drug Use  . Types: Marijuana    Comment: unwilling to provide specific details    Social History   Social History  . Marital status: Single    Spouse name: N/A  . Number of children: N/A  . Years of education: N/A   Social History Main Topics  . Smoking status: Current Every Day Smoker    Packs/day: 0.25    Types: Cigarettes  . Smokeless tobacco: Never Used     Comment: pt unable to provide length of use  . Alcohol use Yes     Comment: unwilling to provide specific details  . Drug use: Yes    Types: Marijuana     Comment: unwilling to provide specific details  . Sexual activity: Not Asked   Other Topics Concern  . None   Social History Narrative  . None   Additional Social History:  Allergies:   Allergies  Allergen Reactions  . Penicillins Other (See Comments)  . Ibuprofen Swelling    Labs:  Results for orders placed or performed during the hospital encounter of 07/31/16 (from the past 48 hour(s))  Comprehensive metabolic panel     Status: Abnormal   Collection Time: 07/31/16  3:04 PM  Result Value Ref Range   Sodium 142 135 - 145 mmol/L   Potassium 3.7 3.5 - 5.1 mmol/L   Chloride 107 101 - 111 mmol/L   CO2 26 22 - 32 mmol/L   Glucose, Bld 94 65 - 99 mg/dL   BUN 9 6 - 20 mg/dL   Creatinine, Ser  0.98 0.61 - 1.24 mg/dL   Calcium 9.4 8.9 - 10.3 mg/dL   Total Protein 8.3 (H) 6.5 - 8.1 g/dL   Albumin 4.4 3.5 - 5.0 g/dL   AST 24 15 - 41 U/L   ALT 23 17 - 63 U/L   Alkaline Phosphatase 30 (L) 38 - 126 U/L   Total Bilirubin 0.4 0.3 - 1.2 mg/dL   GFR calc non Af Amer >60 >60 mL/min   GFR calc Af Amer >60 >60 mL/min    Comment: (NOTE) The eGFR has been calculated using the CKD EPI equation. This calculation has not been validated in all clinical situations. eGFR's persistently <60 mL/min signify possible Chronic Kidney Disease.    Anion gap 9 5 - 15  Ethanol     Status: Abnormal   Collection Time: 07/31/16  3:04 PM  Result Value Ref Range   Alcohol, Ethyl (B) 207 (H) <5 mg/dL    Comment:        LOWEST DETECTABLE LIMIT FOR SERUM ALCOHOL IS 5 mg/dL FOR MEDICAL PURPOSES ONLY   CBC with Diff     Status: None   Collection Time: 07/31/16  3:04 PM  Result Value Ref Range   WBC 8.2 4.0 - 10.5 K/uL   RBC 5.06 4.22 - 5.81 MIL/uL   Hemoglobin 14.3 13.0 - 17.0 g/dL   HCT 42.6 39.0 - 52.0 %   MCV 84.2 78.0 - 100.0 fL   MCH 28.3 26.0 - 34.0 pg   MCHC 33.6 30.0 - 36.0 g/dL   RDW 14.3 11.5 - 15.5 %   Platelets 347 150 - 400 K/uL   Neutrophils Relative % 47 %   Neutro Abs 3.9 1.7 - 7.7 K/uL   Lymphocytes Relative 45 %   Lymphs Abs 3.7 0.7 - 4.0 K/uL   Monocytes Relative 6 %   Monocytes Absolute 0.5 0.1 - 1.0 K/uL   Eosinophils Relative 1 %   Eosinophils Absolute 0.1 0.0 - 0.7 K/uL   Basophils Relative 1 %   Basophils Absolute 0.0 0.0 - 0.1 K/uL  Urine rapid drug screen (hosp performed)not at [facility]     Status: Abnormal   Collection Time: 07/31/16  4:33 PM  Result Value Ref Range   Opiates NONE DETECTED NONE DETECTED   Cocaine POSITIVE (A) NONE DETECTED   Benzodiazepines NONE DETECTED NONE DETECTED   Amphetamines NONE DETECTED NONE DETECTED   Tetrahydrocannabinol POSITIVE (A) NONE DETECTED   Barbiturates NONE DETECTED NONE DETECTED    Comment:        DRUG SCREEN FOR MEDICAL  PURPOSES ONLY.  IF CONFIRMATION IS NEEDED FOR ANY PURPOSE, NOTIFY LAB WITHIN 5 DAYS.        LOWEST DETECTABLE LIMITS FOR URINE DRUG SCREEN Drug Class       Cutoff (ng/mL) Amphetamine  1000 Barbiturate      200 Benzodiazepine   572 Tricyclics       620 Opiates          300 Cocaine          300 THC              50     Current Facility-Administered Medications  Medication Dose Route Frequency Provider Last Rate Last Dose  . LORazepam (ATIVAN) tablet 0-4 mg  0-4 mg Oral Q6H Leo Grosser, MD   Stopped at 08/01/16 [phone]   Followed by  . [START ON 08/03/2016] LORazepam (ATIVAN) tablet 0-4 mg  0-4 mg Oral Q12H Leo Grosser, MD       Current Outpatient Prescriptions  Medication Sig Dispense Refill  . acetaminophen (TYLENOL) 325 MG tablet Take 2 tablets (650 mg total) by mouth every 6 (six) hours as needed for mild pain.    Marland Kitchen alum & mag hydroxide-simeth (MAALOX/MYLANTA) 200-200-20 MG/5ML suspension Take 30 mLs by mouth every 4 (four) hours as needed for indigestion. 355 mL 0  . amantadine (SYMMETREL) 100 MG capsule Take 1 capsule (100 mg total) by mouth 2 (two) times daily. For EPS    . carbamazepine (TEGRETOL XR) 200 MG 12 hr tablet Take 1 tablet (200 mg total) by mouth 2 (two) times daily. For mood stabilization    . chlorproMAZINE (THORAZINE) 25 MG tablet Take 1 tablet (25 mg total) by mouth 3 (three) times daily as needed (severe anxiety/agitation).    . chlorproMAZINE (THORAZINE) 50 MG tablet Take 1 tablet (50 mg total) by mouth 3 (three) times daily. For mood control    . cloNIDine (CATAPRES) 0.1 MG tablet Take 1 tablet (0.1 mg total) by mouth 3 (three) times daily. For high blood pressure 1 tablet 0  . clotrimazole (LOTRIMIN) 1 % cream Apply topically 2 (two) times daily. For skin rash 30 g 0  . magnesium hydroxide (MILK OF MAGNESIA) 400 MG/5ML suspension Take 30 mLs by mouth daily as needed for mild constipation. 360 mL 0  . Multiple Vitamin (MULTIVITAMIN WITH MINERALS) TABS tablet  Take 1 tablet by mouth daily. Vitamin supplement    . thiamine 100 MG tablet Take 1 tablet (100 mg total) by mouth daily. For low thiamine    . traZODone (DESYREL) 50 MG tablet Take 1 tablet (50 mg total) by mouth at bedtime. For sleep    . benzonatate (TESSALON) 200 MG capsule Take 1 capsule (200 mg total) by mouth 3 (three) times daily as needed for cough. (Patient not taking: Reported on 08/01/2016) 20 capsule 0  . chlorproMAZINE (THORAZINE) 25 MG/ML injection Inject 1 mL (25 mg total) into the muscle 3 (three) times daily as needed (severe anxiety/agitation). (Patient not taking: Reported on 08/01/2016) 1 mL 0  . diphenhydrAMINE (BENADRYL) 50 MG/ML injection Inject 1 mL (50 mg total) into the muscle every 6 (six) hours as needed (EPS PREVENTION.). (Patient not taking: Reported on 08/01/2016)  0  . guaiFENesin (MUCINEX) 600 MG 12 hr tablet Take 1 tablet (600 mg total) by mouth 2 (two) times daily. For cough (Patient not taking: Reported on 08/01/2016)    . LORazepam (ATIVAN) 2 MG/ML injection Inject 1 mL (2 mg total) into the muscle every 6 (six) hours as needed (AGITATION/ANXIETY). (Patient not taking: Reported on 08/01/2016) 1 mL 0    Musculoskeletal: Strength & Muscle Tone: within normal limits Gait & Station: normal Patient leans: N/A  Psychiatric Specialty Exam: Physical Exam  Constitutional: He is  oriented to person, place, and time. He appears well-developed and well-nourished.  HENT:  Head: Normocephalic.  Respiratory: Effort normal.  Musculoskeletal: Normal range of motion.  Neurological: He is alert and oriented to person, place, and time.  Psychiatric: He has a normal mood and affect. His speech is normal and behavior is normal. Judgment and thought content normal. Cognition and memory are normal.    Review of Systems  Psychiatric/Behavioral: Positive for substance abuse.  All other systems reviewed and are negative.   Blood pressure (!) 135/92, pulse (!) 109, temperature 98.7 F  (37.1 C), temperature source Axillary, resp. rate 15, weight 86.2 kg (190 lb), SpO2 98 %.Body mass index is 27.26 kg/m.  General Appearance: Casual  Eye Contact:  Good  Speech:  Normal Rate  Volume:  Normal  Mood:  Irritable  Affect:  Congruent  Thought Process:  Coherent and Descriptions of Associations: Intact  Orientation:  Full (Time, Place, and Person)  Thought Content:  WDL and Logical  Suicidal Thoughts:  No  Homicidal Thoughts:  No  Memory:  Immediate;   Good Recent;   Good Remote;   Good  Judgement:  Fair  Insight:  Fair  Psychomotor Activity:  Normal  Concentration:  Concentration: Good and Attention Span: Good  Recall:  Good  Fund of Knowledge:  Fair  Language:  Good  Akathisia:  No  Handed:  Right  AIMS (if indicated):     Assets:  Leisure Time Physical Health Resilience Social Support  ADL's:  Intact  Cognition:  WNL  Sleep:        Treatment Plan Summary: Daily contact with patient to assess and evaluate symptoms and progress in treatment, Medication management and Plan cocaine abuse with cocaine induced mood disorder:  -Crisis stabilization -Medication management:  PRN agitation injections given along with Ativan alcohol detox protocol -Individual and substance abuse counseling  Disposition: No evidence of imminent risk to self or others at present.    Waylan Boga, NP 08/01/2016 8:37 AM  Patient seen face-to-face for psychiatric evaluation, chart reviewed and case discussed with the physician extender and developed treatment plan. Reviewed the information documented and agree with the treatment plan. Corena Pilgrim, MD

## 2016-08-01 NOTE — ED Provider Notes (Signed)
Patient has achieved clinical sobriety from his intoxication overnight and has metabolized his sedation drugs. He is alert and oriented 4. Psychiatry has seen him and intend appropriate for discharge. He does not remember fighting the police yesterday likely due to acute intoxication. He does not endorse SI or HI currently.   Lyndal Pulley, MD 08/01/16 [phone]

## 2016-08-01 NOTE — ED Notes (Signed)
Nasal airway removed so patient could eat better and swallow. Pt is alert and asking for food and drink

## 2016-08-01 NOTE — ED Notes (Signed)
Pt is alert, pt exposing his genitalia , masturbating / MD made aware. Pt is agitated and extremely verbally aggressive to staff.

## 2016-08-01 NOTE — [facility] Suicide Risk Assessment (Signed)
Suicide Risk Assessment  Discharge Assessment   [facility] Discharge Suicide Risk Assessment   Principal Problem: Cocaine abuse with cocaine-induced mood disorder [facility]) Discharge Diagnoses:  Patient Active Problem List   Diagnosis Date Noted  . Cocaine abuse with cocaine-induced mood disorder ([facility]) [F14.14] 08/01/2016    Priority: High  . Alcohol abuse [F10.10] 04/26/2016    Priority: High  . 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) [F19.20] 04/25/2016    Priority: High  . Aggressive behavior [R45.89]   . Alcohol use disorder, severe, dependence ([facility]) [F10.20] 07/14/2016  . Cocaine use disorder, severe, dependence ([facility]) [F14.20] 07/14/2016  . Cannabis use disorder, severe, dependence ([facility]) [F12.20] 07/14/2016  . 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 Schizoaffective disorder, bipolar type ([facility]) [F25.0] 04/25/2016  . Polysubstance abuse [F19.10] 10/14/2014  . Acute psychosis [F23]   . Suicidal ideation [R45.851] 10/09/2014    Total Time spent with patient: 45 minutes  Musculoskeletal: Strength & Muscle Tone: within normal limits Gait & Station: normal Patient leans: N/A  Psychiatric Specialty Exam: Physical Exam  Constitutional: He is oriented to person, place, and time. He appears well-developed and well-nourished.  HENT:  Head: Normocephalic.  Respiratory: Effort normal.  Musculoskeletal: Normal range of motion.  Neurological: He is alert and oriented to person, place, and time.  Psychiatric: He has a normal mood and affect. His speech is normal and behavior is normal. Judgment and thought content normal. Cognition and memory are normal.    Review of Systems  Psychiatric/Behavioral: Positive for substance abuse.  All other systems reviewed and are negative.   Blood pressure (!) 135/92, pulse (!) 109, temperature 98.7 F (37.1 C), temperature source Axillary,  resp. rate 15, weight 86.2 kg (190 lb), SpO2 98 %.Body mass index is 27.26 kg/m.  General Appearance: Casual  Eye Contact:  Good  Speech:  Normal Rate  Volume:  Normal  Mood:  Irritable  Affect:  Congruent  Thought Process:  Coherent and Descriptions of Associations: Intact  Orientation:  Full (Time, Place, and Person)  Thought Content:  WDL and Logical  Suicidal Thoughts:  No  Homicidal Thoughts:  No  Memory:  Immediate;   Good Recent;   Good Remote;   Good  Judgement:  Fair  Insight:  Fair  Psychomotor Activity:  Normal  Concentration:  Concentration: Good and Attention Span: Good  Recall:  Good  Fund of Knowledge:  Fair  Language:  Good  Akathisia:  No  Handed:  Right  AIMS (if indicated):     Assets:  Leisure Time Physical Health Resilience Social Support  ADL's:  Intact  Cognition:  WNL  Sleep:       Mental Status Per Nursing Assessment::   On Admission:   aggression  Demographic Factors:  Male  Loss Factors: Legal issues  Historical Factors: NA  Risk Reduction Factors:   Sense of responsibility to family, Living with another person, especially a relative and Positive social support  Continued Clinical Symptoms:  None   Cognitive Features That Contribute To Risk:  None    Suicide Risk:  Minimal: No identifiable suicidal ideation.  Patients presenting with no risk factors but with morbid ruminations; may be classified as minimal risk based on the severity of the depressive symptoms    Plan Of Care/Follow-up recommendations:  Activity:  as tolerated Diet:  heart healthy diet  LORD, JAMISON, NP 08/01/2016, 8:39 AM

## 2016-09-14 ENCOUNTER — Encounter (HOSPITAL_COMMUNITY): Payer: Self-pay | Admitting: Emergency Medicine

## 2016-09-14 ENCOUNTER — Emergency Department (HOSPITAL_COMMUNITY)
Admission: EM | Admit: 2016-09-14 | Discharge: 2016-09-14 | Disposition: A | Discharge status: Home / Self Care | Payer: Self-pay | Attending: Emergency Medicine | Admitting: Emergency Medicine

## 2016-09-14 ENCOUNTER — Emergency Department (HOSPITAL_COMMUNITY): Payer: Self-pay

## 2016-09-14 DIAGNOSIS — R52 Pain, unspecified: Secondary | ICD-10-CM

## 2016-09-14 DIAGNOSIS — N452 Orchitis: Secondary | ICD-10-CM | POA: Insufficient documentation

## 2016-09-14 DIAGNOSIS — F17210 Nicotine dependence, cigarettes, uncomplicated: Secondary | ICD-10-CM | POA: Insufficient documentation

## 2016-09-14 DIAGNOSIS — Z79899 Other long term (current) drug therapy: Secondary | ICD-10-CM | POA: Insufficient documentation

## 2016-09-14 LAB — CBC WITH DIFFERENTIAL/PLATELET
Basophils Absolute: 0 10*3/uL (ref 0.0–0.1)
Basophils Relative: 0 %
EOS PCT: 3 %
Eosinophils Absolute: 0.2 10*3/uL (ref 0.0–0.7)
HCT: 39.7 % (ref 39.0–52.0)
Hemoglobin: 13.2 g/dL (ref 13.0–17.0)
LYMPHS PCT: 31 %
Lymphs Abs: 2.3 10*3/uL (ref 0.7–4.0)
MCH: 28.4 pg (ref 26.0–34.0)
MCHC: 33.2 g/dL (ref 30.0–36.0)
MCV: 85.6 fL (ref 78.0–100.0)
MONO ABS: 0.9 10*3/uL (ref 0.1–1.0)
Monocytes Relative: 11 %
NEUTROS ABS: 4.2 10*3/uL (ref 1.7–7.7)
Neutrophils Relative %: 55 %
PLATELETS: 267 10*3/uL (ref 150–400)
RBC: 4.64 MIL/uL (ref 4.22–5.81)
RDW: 13.6 % (ref 11.5–15.5)
WBC: 7.6 10*3/uL (ref 4.0–10.5)

## 2016-09-14 MED ORDER — DOXYCYCLINE HYCLATE 100 MG PO CAPS: ORAL_CAPSULE | ORAL | 0 refills | Status: DC

## 2016-09-14 MED ORDER — DOXYCYCLINE HYCLATE 100 MG PO TABS
100.0000 mg | ORAL_TABLET | Freq: Once | ORAL | Status: AC
  Administered 2016-09-14: 100 mg via ORAL
  Filled 2016-09-14: qty 1

## 2016-09-14 MED ORDER — ONDANSETRON 4 MG PO TBDP
8.0000 mg | ORAL_TABLET | Freq: Once | ORAL | Status: AC
  Administered 2016-09-14: 8 mg via ORAL
  Filled 2016-09-14: qty 2

## 2016-09-14 MED ORDER — HYDROCODONE-ACETAMINOPHEN 5-325 MG PO TABS
1.0000 | ORAL_TABLET | Freq: Once | ORAL | Status: AC
  Administered 2016-09-14: 1 via ORAL
  Filled 2016-09-14: qty 1

## 2016-09-14 MED ORDER — AZITHROMYCIN 250 MG PO TABS
2000.0000 mg | ORAL_TABLET | Freq: Once | ORAL | Status: AC
Start: 2016-09-14 — End: 2016-09-14
  Administered 2016-09-14: 2000 mg via ORAL
  Filled 2016-09-14: qty 8

## 2016-09-14 NOTE — ED Notes (Signed)
Pt refuses to give urine sample, pt given a sprite and a coke to drink, pt became angry and started yelling stating "I cant force my pee, you just have to go with the flow, get the f**k out of here"
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
## 2016-09-14 NOTE — ED Notes (Signed)
Called pt name for triage x3. No response.
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
## 2016-09-14 NOTE — ED Triage Notes (Addendum)
Pt to ED from home c/o bilateral testicle pain x 1 week. Pt states it started out as a knot in between both testicles on the bottom and it seems to have become more swollen and painful. Patient denies injury/urinary symptoms. Pt reports taking OTC naproxen with little relief today.

## 2016-09-15 ENCOUNTER — Encounter (HOSPITAL_COMMUNITY): Payer: Self-pay | Admitting: Nurse Practitioner

## 2016-09-15 ENCOUNTER — Emergency Department (HOSPITAL_COMMUNITY)
Admission: EM | Admit: 2016-09-15 | Discharge: 2016-09-15 | Payer: Self-pay | Attending: Emergency Medicine | Admitting: Emergency Medicine

## 2016-09-15 DIAGNOSIS — N452 Orchitis: Secondary | ICD-10-CM

## 2016-09-15 DIAGNOSIS — N451 Epididymitis: Secondary | ICD-10-CM

## 2016-09-15 DIAGNOSIS — Z79899 Other long term (current) drug therapy: Secondary | ICD-10-CM | POA: Insufficient documentation

## 2016-09-15 DIAGNOSIS — F17210 Nicotine dependence, cigarettes, uncomplicated: Secondary | ICD-10-CM | POA: Insufficient documentation

## 2016-09-15 DIAGNOSIS — N453 Epididymo-orchitis: Secondary | ICD-10-CM | POA: Insufficient documentation

## 2016-09-15 LAB — URINALYSIS, ROUTINE W REFLEX MICROSCOPIC
BILIRUBIN URINE: NEGATIVE
GLUCOSE, UA: NEGATIVE mg/dL
HGB URINE DIPSTICK: NEGATIVE
Ketones, ur: NEGATIVE mg/dL
Leukocytes, UA: NEGATIVE
Nitrite: NEGATIVE
PROTEIN: NEGATIVE mg/dL
Specific Gravity, Urine: 1.024 (ref 1.005–1.030)
pH: 6 (ref 5.0–8.0)

## 2016-09-15 MED ORDER — DOXYCYCLINE HYCLATE 100 MG PO TABS
100.0000 mg | ORAL_TABLET | Freq: Once | ORAL | Status: AC
  Administered 2016-09-15: 100 mg via ORAL
  Filled 2016-09-15: qty 1

## 2016-09-15 MED ORDER — DOXYCYCLINE HYCLATE 100 MG PO CAPS: 100.0000 mg | ORAL_CAPSULE | Freq: Two times a day (BID) | ORAL | 0 refills | Status: AC

## 2016-09-15 MED ORDER — ACETAMINOPHEN 325 MG PO TABS
650.0000 mg | ORAL_TABLET | Freq: Once | ORAL | Status: AC
  Administered 2016-09-15: 650 mg via ORAL
  Filled 2016-09-15: qty 2
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
## 2016-09-15 NOTE — ED Notes (Signed)
Pt refusing to wake up enough to take pills. Acknowledges nursing, mumbles, groans, rolls over, and goes back to sleep. reinformed pt of the need for urine specimen and reassured patient that urinalysis and culture were to help assist in diagnosing problems and no additional tests were ordered. Pt did not respond.

## 2016-09-15 NOTE — ED Triage Notes (Signed)
Pt present to ED via EMS from home. States he has been having left testicle pain x 1 week. Per ems patient stated it started in both but now it is mainly the left testicle. He denies any injury or urinary symptoms. [facility] and was told he had an infection and RX given. Per ems patient stated those medicines aren't working. VS 146/88, 110, 16, 99% RA.

## 2016-09-15 NOTE — ED Notes (Signed)
Pt still unable to use the restroom at this time

## 2016-09-15 NOTE — ED Notes (Signed)
Pt states that he urinated before he came in this morning, but is aware that a urine sample is needed.
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
## 2016-09-15 NOTE — ED Notes (Signed)
Pt given meal tray.

## 2016-09-15 NOTE — ED Notes (Addendum)
[employer] at Bedside to take Pt to jail.    This Clinical research associatewriter made aware of patient's actions.  Per chart review, [facility]  [facility] concerning expanding Pt's care plan.
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
## 2016-09-15 NOTE — ED Notes (Signed)
Bed: WA09 Expected date:  Expected time:  Means of arrival:  Comments: 41 yo M  Testicle pain

## 2016-09-15 NOTE — Discharge Instructions (Signed)
You have an infection involving your testicle.  Please take your antibiotics as prescribed. Please return for worsening symptoms, including fever, severe abdominal pain, vomiting or any other symptoms concerning to you.
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
## 2016-09-15 NOTE — ED Triage Notes (Signed)
Pt left ED in custody of [employer] via WC.

## 2016-09-16 LAB — URINE CULTURE: Culture: NO GROWTH

## 2017-08-27 ENCOUNTER — Emergency Department (HOSPITAL_COMMUNITY): Payer: Self-pay

## 2017-08-27 ENCOUNTER — Emergency Department (HOSPITAL_COMMUNITY)
Admission: EM | Admit: 2017-08-27 | Discharge: 2017-08-27 | Disposition: A | Discharge status: Home / Self Care | Payer: Self-pay | Attending: Emergency Medicine | Admitting: Emergency Medicine

## 2017-08-27 ENCOUNTER — Encounter (HOSPITAL_COMMUNITY): Payer: Self-pay | Admitting: Emergency Medicine

## 2017-08-27 DIAGNOSIS — S0285XA Fracture of orbit, unspecified, initial encounter for closed fracture: Secondary | ICD-10-CM

## 2017-08-27 DIAGNOSIS — S0282XA Fracture of other specified skull and facial bones, left side, initial encounter for closed fracture: Secondary | ICD-10-CM | POA: Insufficient documentation

## 2017-08-27 DIAGNOSIS — Y9389 Activity, other specified: Secondary | ICD-10-CM | POA: Insufficient documentation

## 2017-08-27 DIAGNOSIS — Y929 Unspecified place or not applicable: Secondary | ICD-10-CM | POA: Insufficient documentation

## 2017-08-27 DIAGNOSIS — F1410 Cocaine abuse, uncomplicated: Secondary | ICD-10-CM | POA: Insufficient documentation

## 2017-08-27 DIAGNOSIS — F209 Schizophrenia, unspecified: Secondary | ICD-10-CM | POA: Insufficient documentation

## 2017-08-27 DIAGNOSIS — F17210 Nicotine dependence, cigarettes, uncomplicated: Secondary | ICD-10-CM | POA: Insufficient documentation

## 2017-08-27 DIAGNOSIS — R455 Hostility: Secondary | ICD-10-CM | POA: Insufficient documentation

## 2017-08-27 DIAGNOSIS — Y998 Other external cause status: Secondary | ICD-10-CM | POA: Insufficient documentation

## 2017-08-27 DIAGNOSIS — F10120 Alcohol abuse with intoxication, uncomplicated: Secondary | ICD-10-CM | POA: Insufficient documentation

## 2017-08-27 DIAGNOSIS — F1220 Cannabis dependence, uncomplicated: Secondary | ICD-10-CM | POA: Insufficient documentation

## 2017-08-27 DIAGNOSIS — F319 Bipolar disorder, unspecified: Secondary | ICD-10-CM | POA: Insufficient documentation

## 2017-08-27 DIAGNOSIS — F10920 Alcohol use, unspecified with intoxication, uncomplicated: Secondary | ICD-10-CM

## 2017-08-27 DIAGNOSIS — S0083XA Contusion of other part of head, initial encounter: Secondary | ICD-10-CM | POA: Insufficient documentation

## 2017-08-27 HISTORY — DX: Alcohol abuse, uncomplicated: F10.10

## 2017-08-27 MED ORDER — DOXYCYCLINE HYCLATE 100 MG PO CAPS: 100.0000 mg | ORAL_CAPSULE | Freq: Two times a day (BID) | ORAL | 0 refills | Status: DC

## 2017-08-27 MED ORDER — ACETAMINOPHEN 500 MG PO TABS: 1000.0000 mg | ORAL_TABLET | Freq: Four times a day (QID) | ORAL | 0 refills | Status: DC | PRN

## 2017-08-27 NOTE — ED Notes (Signed)
Rn attempted to draw labs from patient. Each time the patient would draw his arm back and fight. Rn went in with [facility] and patient would not cooperate and refused lab draw
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
## 2017-08-27 NOTE — ED Notes (Signed)
MD notified of patients condition and inability to draw labs
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
## 2017-08-27 NOTE — ED Notes (Signed)
Pt yelling and refused blood draw,  Nurse at bedside and will notify provider.
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
## 2017-08-27 NOTE — Discharge Instructions (Addendum)
1.  Do not blow your nose.  Sleep with your head elevated at about 30 degrees.  Make an appointment to follow-up with the ear nose throat specialist listed in your discharge instructions as soon as possible. 2.  Take antibiotics as prescribed. 3.  Return to the emergency department for recheck if you have worsening pain, swelling or changes in your vision.

## 2017-08-28 ENCOUNTER — Emergency Department (HOSPITAL_COMMUNITY)
Admission: EM | Admit: 2017-08-28 | Discharge: 2017-08-28 | Disposition: A | Discharge status: Home / Self Care | Payer: Self-pay | Attending: Emergency Medicine | Admitting: Emergency Medicine

## 2017-08-28 ENCOUNTER — Encounter (HOSPITAL_COMMUNITY): Payer: Self-pay | Admitting: Emergency Medicine

## 2017-08-28 ENCOUNTER — Other Ambulatory Visit: Payer: Self-pay

## 2017-08-28 DIAGNOSIS — Z79899 Other long term (current) drug therapy: Secondary | ICD-10-CM | POA: Insufficient documentation

## 2017-08-28 DIAGNOSIS — X58XXXA Exposure to other specified factors, initial encounter: Secondary | ICD-10-CM | POA: Insufficient documentation

## 2017-08-28 DIAGNOSIS — F319 Bipolar disorder, unspecified: Secondary | ICD-10-CM | POA: Insufficient documentation

## 2017-08-28 DIAGNOSIS — S0285XD Fracture of orbit, unspecified, subsequent encounter for fracture with routine healing: Secondary | ICD-10-CM

## 2017-08-28 DIAGNOSIS — F1010 Alcohol abuse, uncomplicated: Secondary | ICD-10-CM | POA: Insufficient documentation

## 2017-08-28 DIAGNOSIS — F17210 Nicotine dependence, cigarettes, uncomplicated: Secondary | ICD-10-CM | POA: Insufficient documentation

## 2017-08-28 DIAGNOSIS — S0282XD Fracture of other specified skull and facial bones, left side, subsequent encounter for fracture with routine healing: Secondary | ICD-10-CM | POA: Insufficient documentation

## 2017-08-28 DIAGNOSIS — F209 Schizophrenia, unspecified: Secondary | ICD-10-CM | POA: Insufficient documentation

## 2017-08-28 MED ORDER — FLUORESCEIN SODIUM 1 MG OP STRP
1.0000 | ORAL_STRIP | Freq: Once | OPHTHALMIC | Status: AC
  Administered 2017-08-28: 1 via OPHTHALMIC
  Filled 2017-08-28: qty 1

## 2017-08-28 MED ORDER — TETRACAINE HCL 0.5 % OP SOLN
2.0000 [drp] | Freq: Once | OPHTHALMIC | Status: AC
  Administered 2017-08-28: 2 [drp] via OPHTHALMIC
  Filled 2017-08-28: qty 4

## 2017-08-28 MED ORDER — MORPHINE SULFATE (PF) 4 MG/ML IV SOLN
4.0000 mg | Freq: Once | INTRAVENOUS | Status: AC
  Administered 2017-08-28: 4 mg via INTRAMUSCULAR
  Filled 2017-08-28: qty 1
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
## 2017-08-28 NOTE — ED Notes (Signed)
Ophthalmologist at bedside.
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
## 2017-08-28 NOTE — Discharge Instructions (Signed)
Please continue to apply cool compress to eye lid to help with swelling.  As swelling improves your vision will improve.  Avoid blowing your nose for 1 week as it can cause complication of your eye.  Follow up with ENT specialist next week for further care.
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
## 2017-08-28 NOTE — ED Notes (Signed)
Called Pt to reassess. No answer X2 

## 2018-05-27 ENCOUNTER — Emergency Department (HOSPITAL_COMMUNITY)
Admission: EM | Admit: 2018-05-27 | Discharge: 2018-05-27 | Payer: Medicaid Other | Attending: Emergency Medicine | Admitting: Emergency Medicine

## 2018-05-27 DIAGNOSIS — R45850 Homicidal ideations: Secondary | ICD-10-CM | POA: Insufficient documentation

## 2018-05-27 DIAGNOSIS — R456 Violent behavior: Secondary | ICD-10-CM | POA: Diagnosis not present

## 2018-05-27 DIAGNOSIS — F17210 Nicotine dependence, cigarettes, uncomplicated: Secondary | ICD-10-CM | POA: Insufficient documentation

## 2018-05-27 DIAGNOSIS — F10929 Alcohol use, unspecified with intoxication, unspecified: Secondary | ICD-10-CM | POA: Diagnosis not present

## 2018-05-27 LAB — RAPID URINE DRUG SCREEN, HOSP PERFORMED
Amphetamines: NOT DETECTED
Barbiturates: NOT DETECTED
Benzodiazepines: NOT DETECTED
Cocaine: POSITIVE — AB
Opiates: NOT DETECTED
TETRAHYDROCANNABINOL: POSITIVE — AB

## 2018-05-27 MED ORDER — ZIPRASIDONE MESYLATE 20 MG IM SOLR
INTRAMUSCULAR | Status: AC
  Filled 2018-05-27: qty 20

## 2018-05-27 MED ORDER — STERILE WATER FOR INJECTION IJ SOLN
INTRAMUSCULAR | Status: AC
  Filled 2018-05-27: qty 10
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
## 2018-05-27 NOTE — ED Notes (Signed)
Patient has just intentionally spit on brown cabinet.
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
## 2018-05-27 NOTE — ED Notes (Signed)
Patient is making comments about someone not working in hospital and how that person or "bitch ass" better not be working here.
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
## 2018-08-18 ENCOUNTER — Encounter (HOSPITAL_COMMUNITY): Payer: Self-pay | Admitting: *Deleted

## 2018-08-18 ENCOUNTER — Emergency Department (HOSPITAL_COMMUNITY)
Admission: EM | Admit: 2018-08-18 | Discharge: 2018-08-18 | Disposition: A | Discharge status: Home / Self Care | Payer: Medicaid Other | Attending: Emergency Medicine | Admitting: Emergency Medicine

## 2018-08-18 ENCOUNTER — Other Ambulatory Visit: Payer: Self-pay

## 2018-08-18 DIAGNOSIS — M791 Myalgia, unspecified site: Secondary | ICD-10-CM

## 2018-08-18 DIAGNOSIS — M545 Low back pain, unspecified: Secondary | ICD-10-CM

## 2018-08-18 DIAGNOSIS — F17210 Nicotine dependence, cigarettes, uncomplicated: Secondary | ICD-10-CM | POA: Diagnosis not present

## 2018-08-18 DIAGNOSIS — R030 Elevated blood-pressure reading, without diagnosis of hypertension: Secondary | ICD-10-CM | POA: Insufficient documentation

## 2018-08-18 DIAGNOSIS — F250 Schizoaffective disorder, bipolar type: Secondary | ICD-10-CM | POA: Diagnosis not present

## 2018-08-18 DIAGNOSIS — M7918 Myalgia, other site: Secondary | ICD-10-CM | POA: Insufficient documentation

## 2018-08-18 DIAGNOSIS — R780 Finding of alcohol in blood: Secondary | ICD-10-CM | POA: Diagnosis not present

## 2018-08-18 LAB — COMPREHENSIVE METABOLIC PANEL
ALT: 18 U/L (ref 0–44)
AST: 19 U/L (ref 15–41)
Albumin: 4.1 g/dL (ref 3.5–5.0)
Alkaline Phosphatase: 25 U/L — ABNORMAL LOW (ref 38–126)
Anion gap: 10 (ref 5–15)
BUN: 5 mg/dL — ABNORMAL LOW (ref 6–20)
CO2: 26 mmol/L (ref 22–32)
Calcium: 10.2 mg/dL (ref 8.9–10.3)
Chloride: 103 mmol/L (ref 98–111)
Creatinine, Ser: 1.08 mg/dL (ref 0.61–1.24)
GFR calc Af Amer: >60 mL/min (ref >60)
GFR calc non Af Amer: >60 mL/min (ref >60)
Glucose, Bld: 83 mg/dL (ref 70–99)
Potassium: 4.5 mmol/L (ref 3.5–5.1)
Sodium: 139 mmol/L (ref 135–145)
Total Bilirubin: 0.7 mg/dL (ref 0.3–1.2)
Total Protein: 7.8 g/dL (ref 6.5–8.1)

## 2018-08-18 LAB — ETHANOL: Alcohol, Ethyl (B): 50 mg/dL — ABNORMAL HIGH (ref <10)

## 2018-08-18 LAB — CBC
HCT: 42.4 % (ref 39.0–52.0)
Hemoglobin: 13.8 g/dL (ref 13.0–17.0)
MCH: 29 pg (ref 26.0–34.0)
MCHC: 32.5 g/dL (ref 30.0–36.0)
MCV: 89.1 fL (ref 80.0–100.0)
Platelets: 262 10*3/uL (ref 150–400)
RBC: 4.76 MIL/uL (ref 4.22–5.81)
RDW: 13.1 % (ref 11.5–15.5)
WBC: 6.5 10*3/uL (ref 4.0–10.5)
nRBC: 0 % (ref 0.0–0.2)

## 2018-08-18 MED ORDER — ACETAMINOPHEN 500 MG PO TABS
1000.0000 mg | ORAL_TABLET | Freq: Once | ORAL | Status: AC
  Administered 2018-08-18: 1000 mg via ORAL
  Filled 2018-08-18: qty 2

## 2018-08-18 NOTE — ED Notes (Signed)
ED Provider at bedside. 

## 2018-08-18 NOTE — ED Triage Notes (Signed)
The pt reports that he has had generalized body aches for 2 days  He was seen at [facility] ed yesterday  Will mnot naswer questions asked

## 2018-08-18 NOTE — Discharge Instructions (Addendum)
It was our pleasure to provide your ER care today - we hope that you feel better.  Take acetaminophen or ibuprofen as need for symptom relief.   Rest. Drink adequate fluids.  Follow up with primary care doctor in the coming week. Also have your blood pressure rechecked then as it is high today.   For mental health issues, follow up with [facility] in the next few days.   Return to ER if worse, new symptoms, fevers, trouble breathing, other concern.
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
## 2018-08-18 NOTE — ED Notes (Signed)
Patient was not happy that he was being discharged; Pt started yelling and talking to himself; pt yells out "I have the coronavirus"; staff approached door and patient picked up chair and threw it at the door; Patient escorted out via [employer] and security-Monique,RN

## 2018-09-13 ENCOUNTER — Encounter (HOSPITAL_COMMUNITY): Payer: Self-pay | Admitting: Emergency Medicine

## 2018-09-13 ENCOUNTER — Emergency Department (HOSPITAL_COMMUNITY): Payer: Medicaid Other

## 2018-09-13 ENCOUNTER — Emergency Department (HOSPITAL_COMMUNITY)
Admission: EM | Admit: 2018-09-13 | Discharge: 2018-09-13 | Disposition: A | Discharge status: Home / Self Care | Payer: Medicaid Other | Attending: Emergency Medicine | Admitting: Emergency Medicine

## 2018-09-13 DIAGNOSIS — F259 Schizoaffective disorder, unspecified: Secondary | ICD-10-CM | POA: Diagnosis present

## 2018-09-13 DIAGNOSIS — Z79899 Other long term (current) drug therapy: Secondary | ICD-10-CM | POA: Diagnosis not present

## 2018-09-13 DIAGNOSIS — R4182 Altered mental status, unspecified: Secondary | ICD-10-CM

## 2018-09-13 DIAGNOSIS — R443 Hallucinations, unspecified: Secondary | ICD-10-CM

## 2018-09-13 DIAGNOSIS — F17210 Nicotine dependence, cigarettes, uncomplicated: Secondary | ICD-10-CM | POA: Diagnosis not present

## 2018-09-13 DIAGNOSIS — F10920 Alcohol use, unspecified with intoxication, uncomplicated: Secondary | ICD-10-CM | POA: Insufficient documentation

## 2018-09-13 LAB — CBC
HCT: 42.3 % (ref 39.0–52.0)
Hemoglobin: 13.5 g/dL (ref 13.0–17.0)
MCH: 28.9 pg (ref 26.0–34.0)
MCHC: 31.9 g/dL (ref 30.0–36.0)
MCV: 90.6 fL (ref 80.0–100.0)
Platelets: 266 10*3/uL (ref 150–400)
RBC: 4.67 MIL/uL (ref 4.22–5.81)
RDW: 13.6 % (ref 11.5–15.5)
WBC: 5.6 10*3/uL (ref 4.0–10.5)
nRBC: 0 % (ref 0.0–0.2)

## 2018-09-13 LAB — COMPREHENSIVE METABOLIC PANEL
ALT: 28 U/L (ref 0–44)
AST: 25 U/L (ref 15–41)
Albumin: 4.3 g/dL (ref 3.5–5.0)
Alkaline Phosphatase: 32 U/L — ABNORMAL LOW (ref 38–126)
Anion gap: 9 (ref 5–15)
BUN: 10 mg/dL (ref 6–20)
CO2: 26 mmol/L (ref 22–32)
Calcium: 9.4 mg/dL (ref 8.9–10.3)
Chloride: 107 mmol/L (ref 98–111)
Creatinine, Ser: 0.88 mg/dL (ref 0.61–1.24)
GFR calc Af Amer: >60 mL/min (ref >60)
GFR calc non Af Amer: >60 mL/min (ref >60)
Glucose, Bld: 87 mg/dL (ref 70–99)
Potassium: 3.5 mmol/L (ref 3.5–5.1)
Sodium: 142 mmol/L (ref 135–145)
Total Bilirubin: 0.4 mg/dL (ref 0.3–1.2)
Total Protein: 7.9 g/dL (ref 6.5–8.1)

## 2018-09-13 LAB — RAPID URINE DRUG SCREEN, HOSP PERFORMED
Amphetamines: NOT DETECTED
Barbiturates: NOT DETECTED
Benzodiazepines: NOT DETECTED
Cocaine: POSITIVE — AB
Opiates: NOT DETECTED
Tetrahydrocannabinol: POSITIVE — AB

## 2018-09-13 LAB — ETHANOL: Alcohol, Ethyl (B): 227 mg/dL — ABNORMAL HIGH (ref <10)

## 2018-09-13 LAB — SALICYLATE LEVEL: Salicylate Lvl: <7 mg/dL (ref 2.8–30.0)

## 2018-09-13 LAB — ACETAMINOPHEN LEVEL: Acetaminophen (Tylenol), Serum: <10 ug/mL — ABNORMAL LOW (ref 10–30)

## 2018-09-13 MED ORDER — DIPHENHYDRAMINE HCL 25 MG PO TABS: 25.0000 mg | ORAL_TABLET | Freq: Four times a day (QID) | ORAL | 0 refills | Status: DC | PRN

## 2018-09-13 MED ORDER — CARBAMAZEPINE ER 200 MG PO TB12: 200.0000 mg | ORAL_TABLET | Freq: Two times a day (BID) | ORAL | 0 refills | Status: DC

## 2018-09-13 NOTE — BH Assessment (Signed)
[facility] Assessment Progress Note  Per Juanetta Beets, DO, this pt does not require psychiatric hospitalization at this time.  Pt is to be discharged from [facility] with recommendation to follow up either with his unspecified regular outpatient provider, or with either [facility] [place], or with [employer].  This has been included in pt's discharge instructions.  Pt's nurse has been notified.  Doylene Canning, MA Triage Specialist [phone]
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
## 2018-09-13 NOTE — Discharge Instructions (Addendum)
For your behavioral health needs, you are advised to follow up with your regular outpatient provider, or with one of the providers listed below.  Contact them at your earliest opportunity:       [facility]      [address]      [place], [place] [address]      [phone]       [facility]      [address]      [place], [place] [address]      [phone]      Crisis number: [phone]
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
## 2018-09-13 NOTE — BHH Counselor (Signed)
Per Narda Amber, DO, Pt is psych-cleared and may be discharged when sober.
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
## 2018-09-13 NOTE — ED Notes (Signed)
Asked for urine  

## 2018-09-13 NOTE — ED Notes (Signed)
Asked for urine again  

## 2020-03-02 ENCOUNTER — Encounter (HOSPITAL_COMMUNITY): Payer: Self-pay

## 2020-03-02 ENCOUNTER — Other Ambulatory Visit: Payer: Self-pay

## 2020-03-02 ENCOUNTER — Emergency Department (HOSPITAL_COMMUNITY)
Admission: EM | Admit: 2020-03-02 | Discharge: 2020-03-04 | Disposition: A | Discharge status: Home / Self Care | Payer: Medicaid Other | Attending: Emergency Medicine | Admitting: Emergency Medicine

## 2020-03-02 DIAGNOSIS — F10129 Alcohol abuse with intoxication, unspecified: Secondary | ICD-10-CM | POA: Diagnosis present

## 2020-03-02 DIAGNOSIS — Z20822 Contact with and (suspected) exposure to covid-19: Secondary | ICD-10-CM | POA: Diagnosis not present

## 2020-03-02 DIAGNOSIS — F1910 Other psychoactive substance abuse, uncomplicated: Secondary | ICD-10-CM | POA: Diagnosis present

## 2020-03-02 DIAGNOSIS — F1994 Other psychoactive substance use, unspecified with psychoactive substance-induced mood disorder: Secondary | ICD-10-CM | POA: Diagnosis not present

## 2020-03-02 DIAGNOSIS — F250 Schizoaffective disorder, bipolar type: Secondary | ICD-10-CM | POA: Insufficient documentation

## 2020-03-02 DIAGNOSIS — F1220 Cannabis dependence, uncomplicated: Secondary | ICD-10-CM | POA: Diagnosis not present

## 2020-03-02 DIAGNOSIS — F17210 Nicotine dependence, cigarettes, uncomplicated: Secondary | ICD-10-CM | POA: Diagnosis not present

## 2020-03-02 DIAGNOSIS — R45851 Suicidal ideations: Secondary | ICD-10-CM

## 2020-03-02 DIAGNOSIS — F10920 Alcohol use, unspecified with intoxication, uncomplicated: Secondary | ICD-10-CM

## 2020-03-02 DIAGNOSIS — F319 Bipolar disorder, unspecified: Secondary | ICD-10-CM | POA: Diagnosis not present

## 2020-03-02 DIAGNOSIS — Z79899 Other long term (current) drug therapy: Secondary | ICD-10-CM | POA: Diagnosis not present

## 2020-03-02 DIAGNOSIS — F209 Schizophrenia, unspecified: Secondary | ICD-10-CM | POA: Diagnosis present
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
## 2020-03-03 LAB — BASIC METABOLIC PANEL
Anion gap: 14 (ref 5–15)
BUN: 9 mg/dL (ref 6–20)
CO2: 25 mmol/L (ref 22–32)
Calcium: 9.3 mg/dL (ref 8.9–10.3)
Chloride: 102 mmol/L (ref 98–111)
Creatinine, Ser: 1.02 mg/dL (ref 0.61–1.24)
GFR, Estimated: >60 mL/min (ref >60)
Glucose, Bld: 121 mg/dL — ABNORMAL HIGH (ref 70–99)
Potassium: 5 mmol/L (ref 3.5–5.1)
Sodium: 141 mmol/L (ref 135–145)

## 2020-03-03 LAB — CBC WITH DIFFERENTIAL/PLATELET
Abs Immature Granulocytes: 0.03 10*3/uL (ref 0.00–0.07)
Basophils Absolute: 0 10*3/uL (ref 0.0–0.1)
Basophils Relative: 0 %
Eosinophils Absolute: 0 10*3/uL (ref 0.0–0.5)
Eosinophils Relative: 0 %
HCT: 49.4 % (ref 39.0–52.0)
Hemoglobin: 16.2 g/dL (ref 13.0–17.0)
Immature Granulocytes: 0 %
Lymphocytes Relative: 18 %
Lymphs Abs: 1.6 10*3/uL (ref 0.7–4.0)
MCH: 28.5 pg (ref 26.0–34.0)
MCHC: 32.8 g/dL (ref 30.0–36.0)
MCV: 87 fL (ref 80.0–100.0)
Monocytes Absolute: 0.6 10*3/uL (ref 0.1–1.0)
Monocytes Relative: 7 %
Neutro Abs: 6.6 10*3/uL (ref 1.7–7.7)
Neutrophils Relative %: 75 %
Platelets: 299 10*3/uL (ref 150–400)
RBC: 5.68 MIL/uL (ref 4.22–5.81)
RDW: 13 % (ref 11.5–15.5)
WBC: 8.9 10*3/uL (ref 4.0–10.5)
nRBC: 0 % (ref 0.0–0.2)

## 2020-03-03 LAB — SALICYLATE LEVEL: Salicylate Lvl: <7 mg/dL — ABNORMAL LOW (ref 7.0–30.0)

## 2020-03-03 LAB — RAPID URINE DRUG SCREEN, HOSP PERFORMED
Amphetamines: NOT DETECTED
Barbiturates: NOT DETECTED
Benzodiazepines: POSITIVE — AB
Cocaine: POSITIVE — AB
Opiates: NOT DETECTED
Tetrahydrocannabinol: NOT DETECTED

## 2020-03-03 LAB — RESPIRATORY PANEL BY RT PCR (FLU A&B, COVID)
Influenza A by PCR: NEGATIVE
Influenza B by PCR: NEGATIVE
SARS Coronavirus 2 by RT PCR: NEGATIVE

## 2020-03-03 LAB — ETHANOL: Alcohol, Ethyl (B): 222 mg/dL — ABNORMAL HIGH (ref <10)

## 2020-03-03 LAB — ACETAMINOPHEN LEVEL: Acetaminophen (Tylenol), Serum: <10 ug/mL — ABNORMAL LOW (ref 10–30)

## 2020-03-03 MED ORDER — THIAMINE HCL 100 MG PO TABS
100.0000 mg | ORAL_TABLET | Freq: Every day | ORAL | Status: DC
  Administered 2020-03-03 – 2020-03-04 (×2): 100 mg via ORAL
  Filled 2020-03-03 (×2): qty 1

## 2020-03-03 MED ORDER — LORAZEPAM 2 MG/ML IJ SOLN: 0.0000 mg | Freq: Two times a day (BID) | INTRAMUSCULAR | Status: DC

## 2020-03-03 MED ORDER — LORAZEPAM 1 MG PO TABS: 0.0000 mg | ORAL_TABLET | Freq: Two times a day (BID) | ORAL | Status: DC

## 2020-03-03 MED ORDER — THIAMINE HCL 100 MG/ML IJ SOLN: 100.0000 mg | Freq: Every day | INTRAMUSCULAR | Status: DC

## 2020-03-03 MED ORDER — LORAZEPAM 1 MG PO TABS
0.0000 mg | ORAL_TABLET | Freq: Four times a day (QID) | ORAL | Status: DC
  Administered 2020-03-03: 2 mg via ORAL
  Administered 2020-03-04: 1 mg via ORAL
  Filled 2020-03-03 (×2): qty 2

## 2020-03-03 MED ORDER — LORAZEPAM 2 MG/ML IJ SOLN: 0.0000 mg | Freq: Four times a day (QID) | INTRAMUSCULAR | Status: DC

## 2020-03-03 NOTE — ED Notes (Signed)
Security at bedside, patient wanded and changed into burgundy scrubs and yellow socks, belongings bagged and labeled. Pt ambulated to bathroom without assistance.

## 2020-03-03 NOTE — ED Notes (Signed)
TTS at bedside. 

## 2020-03-03 NOTE — ED Notes (Addendum)
Godson Pollan brother [phone] sts he was told the pt could fill out form so he can be notified of pts status and be given updates.

## 2020-03-03 NOTE — BH Assessment (Addendum)
Patient states that Clemons Salvucci [phone] is his legal guardian.  Clinician contacted his brother Tristen Luce [phone] whom states that he was patient's POA x9 yrs. Reported  This POA  role ws turned over to another brother, Djibril Glogowski (b [phone] x3 months ago.   Clinician spoke to Wynelle Cleveland who confirmed that he is the new POA. He is not sure if he has the legal paperwork to confirm his POA status but will look for it and provide [facility] with a copy.

## 2020-03-03 NOTE — ED Provider Notes (Signed)
Emergency Medicine Observation Re-evaluation Note  Garrett Arellano is a 44 y.o. male, seen on rounds today.  Pt initially presented to the ED for complaints of Alcohol Intoxication and Suicidal Currently, the patient is resting in the bed.  He has on restraints.  Physical Exam  BP (!) 132/98   Pulse 92   Temp 97.9 F (36.6 C) (Oral)   Resp 14   SpO2 98%  Physical Exam General: No acute distress Cardiac: Regular rate and rhythm Lungs: Clear to auscultation Psych: Currently calm and cooperative, denies complaints  ED Course / MDM  EKG:    I have reviewed the labs performed to date as well as medications administered while in observation.  Recent changes in the last 24 hours include the need for physical restraints due to the patient's aggression.  Plan  Current plan is for psychiatric evaluation.  We will continue to monitor and see if we can remove the restraints later.    Linwood Dibbles, MD 03/03/20 [phone]
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
## 2020-03-03 NOTE — ED Notes (Signed)
Brother Jaleil Renwick called and updated. Patient verbalized that he wants an update given to his brother.
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
## 2020-03-04 ENCOUNTER — Encounter (HOSPITAL_COMMUNITY): Payer: Self-pay | Admitting: Psychiatry

## 2020-03-04 ENCOUNTER — Other Ambulatory Visit: Payer: Self-pay | Admitting: Psychiatric/Mental Health

## 2020-03-04 ENCOUNTER — Other Ambulatory Visit: Payer: Self-pay

## 2020-03-04 ENCOUNTER — Inpatient Hospital Stay (HOSPITAL_COMMUNITY)
Admission: AD | Admit: 2020-03-04 | Discharge: 2020-03-07 | DRG: 885 | Disposition: A | Discharge status: Home / Self Care | Payer: Medicaid Other | Attending: Psychiatry | Admitting: Psychiatry

## 2020-03-04 DIAGNOSIS — F1994 Other psychoactive substance use, unspecified with psychoactive substance-induced mood disorder: Secondary | ICD-10-CM

## 2020-03-04 DIAGNOSIS — F17210 Nicotine dependence, cigarettes, uncomplicated: Secondary | ICD-10-CM | POA: Diagnosis not present

## 2020-03-04 DIAGNOSIS — F10920 Alcohol use, unspecified with intoxication, uncomplicated: Secondary | ICD-10-CM

## 2020-03-04 DIAGNOSIS — F10129 Alcohol abuse with intoxication, unspecified: Secondary | ICD-10-CM | POA: Diagnosis not present

## 2020-03-04 DIAGNOSIS — F1414 Cocaine abuse with cocaine-induced mood disorder: Secondary | ICD-10-CM | POA: Diagnosis not present

## 2020-03-04 DIAGNOSIS — F209 Schizophrenia, unspecified: Secondary | ICD-10-CM

## 2020-03-04 DIAGNOSIS — F1910 Other psychoactive substance abuse, uncomplicated: Secondary | ICD-10-CM

## 2020-03-04 DIAGNOSIS — F17200 Nicotine dependence, unspecified, uncomplicated: Secondary | ICD-10-CM | POA: Diagnosis present

## 2020-03-04 DIAGNOSIS — F1010 Alcohol abuse, uncomplicated: Secondary | ICD-10-CM | POA: Diagnosis not present

## 2020-03-04 DIAGNOSIS — R45851 Suicidal ideations: Secondary | ICD-10-CM

## 2020-03-04 MED ORDER — CHLORPROMAZINE HCL 25 MG PO TABS
25.0000 mg | ORAL_TABLET | Freq: Once | ORAL | Status: AC
  Administered 2020-03-04: 25 mg via ORAL
  Filled 2020-03-04: qty 1

## 2020-03-04 MED ORDER — TRAZODONE HCL 50 MG PO TABS
50.0000 mg | ORAL_TABLET | Freq: Every evening | ORAL | Status: DC | PRN
  Filled 2020-03-04 (×2): qty 1

## 2020-03-04 MED ORDER — ADULT MULTIVITAMIN W/MINERALS CH
1.0000 | ORAL_TABLET | Freq: Every day | ORAL | Status: DC
  Administered 2020-03-04 – 2020-03-07 (×4): 1 via ORAL
  Filled 2020-03-04 (×7): qty 1

## 2020-03-04 MED ORDER — HALOPERIDOL 5 MG PO TABS
10.0000 mg | ORAL_TABLET | Freq: Two times a day (BID) | ORAL | Status: DC | PRN
  Administered 2020-03-04: 10 mg via ORAL
  Filled 2020-03-04: qty 2

## 2020-03-04 MED ORDER — BENZTROPINE MESYLATE 1 MG PO TABS
1.0000 mg | ORAL_TABLET | Freq: Two times a day (BID) | ORAL | Status: DC
  Administered 2020-03-04: 1 mg via ORAL
  Filled 2020-03-04: qty 1

## 2020-03-04 MED ORDER — HALOPERIDOL 5 MG PO TABS
5.0000 mg | ORAL_TABLET | Freq: Once | ORAL | Status: AC
  Administered 2020-03-04: 5 mg via ORAL
  Filled 2020-03-04: qty 1

## 2020-03-04 MED ORDER — HALOPERIDOL LACTATE 5 MG/ML IJ SOLN: 10.0000 mg | Freq: Two times a day (BID) | INTRAMUSCULAR | Status: DC | PRN

## 2020-03-04 MED ORDER — LORAZEPAM 1 MG PO TABS: 1.0000 mg | ORAL_TABLET | Freq: Four times a day (QID) | ORAL | Status: DC | PRN

## 2020-03-04 MED ORDER — ACETAMINOPHEN 325 MG PO TABS: 650.0000 mg | ORAL_TABLET | Freq: Four times a day (QID) | ORAL | Status: DC | PRN

## 2020-03-04 MED ORDER — LOPERAMIDE HCL 2 MG PO CAPS: 2.0000 mg | ORAL_CAPSULE | ORAL | Status: DC | PRN

## 2020-03-04 MED ORDER — HALOPERIDOL 5 MG PO TABS
20.0000 mg | ORAL_TABLET | Freq: Two times a day (BID) | ORAL | Status: DC
  Administered 2020-03-04: 20 mg via ORAL
  Filled 2020-03-04: qty 4

## 2020-03-04 MED ORDER — LAMOTRIGINE 100 MG PO TABS: 200.0000 mg | ORAL_TABLET | Freq: Every day | ORAL | Status: DC

## 2020-03-04 MED ORDER — ALUM & MAG HYDROXIDE-SIMETH 200-200-20 MG/5ML PO SUSP: 30.0000 mL | ORAL | Status: DC | PRN

## 2020-03-04 MED ORDER — CLONIDINE HCL 0.1 MG PO TABS: 0.1000 mg | ORAL_TABLET | Freq: Three times a day (TID) | ORAL | Status: DC

## 2020-03-04 MED ORDER — MIRTAZAPINE 7.5 MG PO TABS: 7.5000 mg | ORAL_TABLET | Freq: Every day | ORAL | Status: DC

## 2020-03-04 MED ORDER — HYDROXYZINE HCL 25 MG PO TABS
25.0000 mg | ORAL_TABLET | Freq: Four times a day (QID) | ORAL | Status: DC | PRN
  Administered 2020-03-04: 25 mg via ORAL
  Filled 2020-03-04: qty 1

## 2020-03-04 MED ORDER — MAGNESIUM HYDROXIDE 400 MG/5ML PO SUSP: 30.0000 mL | Freq: Every day | ORAL | Status: DC | PRN

## 2020-03-04 MED ORDER — ONDANSETRON 4 MG PO TBDP: 4.0000 mg | ORAL_TABLET | Freq: Four times a day (QID) | ORAL | Status: DC | PRN

## 2020-03-04 MED ORDER — THIAMINE HCL 100 MG PO TABS
100.0000 mg | ORAL_TABLET | Freq: Every day | ORAL | Status: DC
  Administered 2020-03-05 – 2020-03-07 (×3): 100 mg via ORAL
  Filled 2020-03-04 (×5): qty 1

## 2020-03-04 NOTE — ED Provider Notes (Signed)
Emergency Medicine Observation Re-evaluation Note  Garrett Arellano is a 44 y.o. male, seen on rounds today.  Pt initially presented to the ED for complaints of Alcohol Intoxication and Suicidal Currently, the patient is here with report of SI Review of record reveals care plan from 2018 when patient had exhibited aggressive behavior.  Patient currently resting comfortably and has not exhibited aggressive behavior this evaluation. He presented with etoh intoxication and SI.     Physical Exam  BP (!) 170/97 (BP Location: Right Arm)   Pulse (!) 107   Temp 98.2 F (36.8 C) (Oral)   Resp 20   SpO2 98%  Physical Exam General: resting in bed  Cardiac: rrr Lungs: cta Psych: flat,  ED Course / MDM  EKG:    I have reviewed the labs performed to date as well as medications administered while in observation.  Recent changes in the last 24 hours include decreased intoxication .  Plan  Current plan is for psychiatric evaluation . Patient is not under full IVC at this time.   Margarita Grizzle, MD 03/04/20 1304

## 2020-03-04 NOTE — BH Assessment (Addendum)
[facility] Assessment Progress Note  Per Shuvon Rankin, NP, this pt requires psychiatric hospitalization at this time.  Gretta Arab, RN, [facility] has assigned pt to [facility] Rm 504-1; [facility] is now ready to receive pt.  Pt has signed Voluntary Admission and Consent for Treatment, as well as Consent to Release Information to [facility] and to his brothers (his brother, Sharma Covert is his putative healthcare power of attorney), and a notification call has been place to the provider and to the [employer].  Signed forms have been faxed to [facility].  EDP Margarita Grizzle, MD and pt's nurse, Diane, have been notified, and Diane agrees to send original paperwork along with pt via [facility], and to call report to [phone].  Doylene Canning, [place] [facility] Coordinator [phone]   Addendum:  At 13:37 this writer called Sharma Covert [phone]), notifying him of pt's disposition and asking that he provide [place] with a copy of HCPOA, to which he agrees.  Doylene Canning, [place] [facility] Coordinator [phone]

## 2020-03-04 NOTE — ED Notes (Signed)
Report given to [facility].  [facility] called for transport.

## 2020-03-04 NOTE — Progress Notes (Signed)
Patient ID: Garrett Arellano, male   DOB: February 17, 1976, 44 y.o.   MRN: [id number] Admission Note  Pt is a 44 yo male that presents voluntarily on 03/04/2020 with worsening avh and "medications not working", all culminating in the pt having suicidal ideations. Pt states their medications haven't been working and when this happens they become suicidal. Pt also states they hear voices of people arguing and babies crying. Pt has visual hallucinations of things falling out of thin air. Pt states they have a pcp but haven't been going to see them lately. Pt denies drug use but tested positive for cocaine and benzos. Pt states they are an occ smoker but denied supplementation. Pt states they drink "2-3 cans" of wine a night, 2-3 days/week. Pt denies any past/present verbal/physical/sexual abuse. Pt denies self neglect. Pt expresses sadness and depression. Pt seems delayed with answering questions. Pt is assertive with Arellano though. Pt denies any current physical pain. Pt states they live with their brother and the brother and sister are support. Pt states they won't be returning there when they leave. Pt was vague with where they were going, but it seemed they were going to be living alone when they left. Pt denies issues with medication obtainment either financially or with transportation. Pt denies current si/hi/ah/vh and verbally agrees to approach staff if these become apparent or before  harming self/others while at [facility]. Consents signed, handbook detailing the patient's rights, responsibilities, and visitor guidelines provided. Skin/belongings search completed and patient oriented to unit. Patient stable at this time. Patient given the opportunity to express concerns and ask questions. Patient given toiletries. Will continue to monitor.    Garrett Arellano 11/8:  Garrett Arellano is an 44 y.o. malewith a history of alcohol use disorder, cocaine use disorder, cannabis use disorder, aggressive behavior, schizoaffective  disorder, and substance-induced anxiety disorder, and acute psychosis who presents to the emergency department by EMS. Upon arrival to the ED patient presented with alcohol intoxication and SI. Plan to shoot himself in the head.  Clinician assessed patient on this day. He was apprehensive in answering most of today's Arellano questions. Stating, "I don't want to answer these questions anymore, don't ask them". Despite, his cooperation he did report suicidal ideations. He reported a suicide plan but refused to disclose his plan. He reports prior suicide attempts but refused to provide details on history. Denied self mutilating behaviors. Family history unknown. Denied depression but reported feelings of increased anger/irritability and guilty. He declined to provide any insight on his stressors and/or triggers. Denied HI. Reports a history of aggression. States that he his aggression stems from "mood changes". Denies current legal issues. Patient with AVH's. States that he hears multiple voices but declined to provide any further details. Patient denies alcohol and drug use. However, UDS was + for alcohol. Also, his medical records indicate a history of cocaine and cannabis use. [facility] He was apprehensive in providing information about his last hospitalizations, reason for hospitalizations, etc. Patient reports states that Garrett Arellano manages his mental health medications. He does not know the name of any of his medications. However, states that he is compliant. Patient states that his brother Garrett Arellano [phone]) is his legal guardian. He provided consent to speak to his brother to obtain collateral information.   Collateral Information:  Clinician contactedhis brother Garrett Arellano [phone] states that hewaspatient'sPOA x9 yrs. The POArole was turned over to another brother, Garrett Arellano  [phone] x3 months ago.  Garrett Arellano states  that Garrett Arellano is very new to managing patient's affairs and may not have a lot of information needed. Therefore, Garrett Arellano was willing to provide relevant information.   *Collateral history: Garrett Arellano has a 9 yr history of psychiatric issues which was subsequently around the death of their mother. Garrett Arellano was in jail the day his mother became very ill 9 yrs ago. His family made accommodations with the sheriffs department for to see his mother before her death. After, visiting with her mother she immediately passed away. Patient did not take this very well and has since had psychiatric issues. According to [facility], patient has a lot of guilt about his mothers death. States that he didn't treat her very well when she was living. He would be verbally abusive to her mother. He has been in/out of jail and and suffering with his mental health issues x9 yrs. Yesterday, marks the anniversary of his mothers death. In addition to this anniversary patient has been in jail for the past year. He was released x3 days ago.  His never POA, Garrett Arellano arranged for patient to move into his apartment the day of his discharge from jail. Patient has no history of being on his own or having his resident. Therefore, this transition may be a factor in patient's current presentation. Garrett Arellano says that the jail was managing patient's medications but he doesn't know what they were giving him. Upon discharge from jail, he was suppose to have prescriptions called in by the jail nurse but they never were. Patient is suspected to have been off his medications x3 days.  Clinician spoke to [place]ipeswho confirmed that he is the new POA. He had limited insight on patient's mental health history. However, would like to be involved as much as possible. He is not sure if he has the legal paperwork to confirm his POA status but will look for it and provide [facility] with a copy asap.

## 2020-03-04 NOTE — ED Notes (Signed)
Transported to [facility] by [employer]. Belongings given to driver. Pt was cooperative and appropriate.

## 2020-03-04 NOTE — ED Notes (Signed)
Unable to call report because nurse is at lunch.  Will return my call.
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
## 2020-03-04 NOTE — ED Notes (Signed)
Pt states that he was not in restraints during this admission. Other staff not aware of pt being in restraints during this admission.  Pt has been alert, calm and cooperative, sleeping mostly. Asked for his haldol and also wants to have his thorazine ordered. This writer messaged Shuvon Rankin NP concerning the thorazine.

## 2020-03-04 NOTE — Tx Team (Signed)
Initial Treatment Plan 03/04/2020 3:59 PM Garrett Arellano ION:[id number]    PATIENT STRESSORS: Health problems Medication change or noncompliance   PATIENT STRENGTHS: Barrister's clerk for treatment/growth Physical Health Supportive family/friends   PATIENT IDENTIFIED PROBLEMS: anxiety  Suicidal ideations  avh  "medications not working"               DISCHARGE CRITERIA:  Ability to meet basic life and health needs Improved stabilization in mood, thinking, and/or behavior Motivation to continue treatment in a less acute level of care Need for constant or close observation no longer present  PRELIMINARY DISCHARGE PLAN: Outpatient therapy Return to previous living arrangement  PATIENT/FAMILY INVOLVEMENT: This treatment plan has been presented to and reviewed with the patient, Garrett Arellano.  The patient and family have been given the opportunity to ask questions and make suggestions.  Raylene Miyamoto, RN 03/04/2020, 3:59 PM

## 2020-03-05 DIAGNOSIS — F1994 Other psychoactive substance use, unspecified with psychoactive substance-induced mood disorder: Secondary | ICD-10-CM

## 2020-03-05 MED ORDER — CHLORPROMAZINE HCL 25 MG PO TABS
25.0000 mg | ORAL_TABLET | Freq: Three times a day (TID) | ORAL | Status: DC
  Administered 2020-03-05: 25 mg via ORAL
  Filled 2020-03-05 (×3): qty 1

## 2020-03-05 MED ORDER — CHLORPROMAZINE HCL 25 MG PO TABS
25.0000 mg | ORAL_TABLET | Freq: Two times a day (BID) | ORAL | Status: DC
  Administered 2020-03-05 – 2020-03-07 (×4): 25 mg via ORAL
  Filled 2020-03-05 (×8): qty 1

## 2020-03-05 MED ORDER — HALOPERIDOL 5 MG PO TABS
10.0000 mg | ORAL_TABLET | Freq: Two times a day (BID) | ORAL | Status: DC
  Administered 2020-03-05 – 2020-03-06 (×3): 10 mg via ORAL
  Filled 2020-03-05 (×5): qty 2

## 2020-03-05 MED ORDER — MIRTAZAPINE 15 MG PO TABS
15.0000 mg | ORAL_TABLET | Freq: Every day | ORAL | Status: DC
  Administered 2020-03-06: 15 mg via ORAL
  Filled 2020-03-05 (×4): qty 1

## 2020-03-05 NOTE — [facility] Group Notes (Signed)
[facility] LCSW Group Therapy  03/05/2020 2:21 PM  Type of Therapy:  Group Therapy  Participation Level:  Did Not Attend   Summary of Progress/Problems: Patient was invited to attend by CSW, however this patient declined to attend.  Neco Kling A Jaslynne Dahan 03/05/2020, 2:21 PM

## 2020-03-05 NOTE — [facility] Suicide Risk Assessment (Signed)
[facility] Admission Suicide Risk Assessment   Nursing information obtained from:  Patient Demographic factors:  Unemployed, Low socioeconomic status Current Mental Status:  Suicidal ideation indicated by patient, Plan includes specific time, place, or method, Intention to act on suicide plan, Suicidal ideation indicated by others, Self-harm thoughts, Belief that plan would result in death, Suicide plan Loss Factors:  Decline in physical health Historical Factors:  Prior suicide attempts, Impulsivity Risk Reduction Factors:  Sense of responsibility to family, Positive social support, Living with another person, especially a relative, Positive therapeutic relationship  Total Time spent with patient: 20 minutes Principal Problem: Substance induced mood disorder (HCC) Diagnosis:  Principal Problem:   Substance induced mood disorder (HCC) Active Problems:   Schizophrenia, unspecified type (HCC)   Suicidal ideation  Subjective Data:   [facility] days ago. Had been on medications while there but reports that he was unable to get them once discharged. Has history of substance use disorders. On presentation to the ED had an EtOH level of 222. He reported SI of shooting himself in the head. UDS+cocaine and BZD  Unable to get a good history from patient due to agitation. When asked questions he would state "I want my meds," and would get more agitated as questioning continued. When his brother Jediah Horger was contacted he stated he was no longer his brothers POA (had been for 9 years), but now it was there other brother Jeromiah Ohalloran. Awaiting proper paperwork but Sharma Covert did not know current status of medications.  Patient reports medications as haldol, remeron, thorazine but unable to give much information about doses, frequency,e tc.    Continued Clinical Symptoms:  Alcohol Use Disorder Identification Test Final Score (AUDIT): 4 The "Alcohol Use Disorders Identification Test",  Guidelines for Use in Primary Care, Second Edition.  World Science writer [facility]). Score between 0-7:  no or low risk or alcohol related problems. Score between 8-15:  moderate risk of alcohol related problems. Score between 16-19:  high risk of alcohol related problems. Score 20 or above:  warrants further diagnostic evaluation for alcohol dependence and treatment.   CLINICAL FACTORS:   Alcohol/Substance Abuse/Dependencies Previous Psychiatric Diagnoses and Treatments   Musculoskeletal: Strength & Muscle Tone: within normal limits Gait & Station: normal Patient leans: N/A  Psychiatric Specialty Exam: Physical Exam Constitutional:      Appearance: Normal appearance.  Eyes:     Extraocular Movements: Extraocular movements intact.  Pulmonary:     Effort: Pulmonary effort is normal.  Neurological:     Mental Status: He is alert.     Review of Systems  Constitutional: Negative.   HENT: Negative.   Eyes: Negative.   Respiratory: Negative.   Psychiatric/Behavioral: Positive for hallucinations.    Blood pressure (!) 153/99, pulse (!) 106, temperature 98.2 F (36.8 C), temperature source Oral, resp. rate 16, height 6' 1.23" (1.86 m), weight 95.3 kg, SpO2 100 %.Body mass index is 27.53 kg/m.  General Appearance: Fairly Groomed and Guarded  Eye Contact:  Fair  Speech:  Normal Rate  Volume:  Normal  Mood:  "fine"  Affect:  irritable,labile  Thought Process:  Linear superficially at least, UTA fully as pt became agitated with questions  Orientation:  Full (Time, Place, and Person)  Thought Content:  Hallucinations: Auditory Visual and focused on getting medications and beiing discharged  Suicidal Thoughts:  denied  Homicidal Thoughts:  denied  Memory:  Immediate;   Fair Recent;   Fair  Judgement:  Impaired  Insight:  Fair , is aware that he needs to be on medication and requesting them  Psychomotor Activity:  Normal  Concentration:  Concentration: Fair  Recall:  [employer] of Knowledge:  UTA  Language:  Fair  Akathisia:  denied  Handed:  Right  AIMS (if indicated):     Assets:  Desire for Improvement Physical Health Resilience  ADL's:  Intact  Cognition:  WNL  Sleep:  Number of Hours: 8.75      COGNITIVE FEATURES THAT CONTRIBUTE TO RISK:  Thought constriction (tunnel vision)    SUICIDE RISK:   Minimal: No identifiable suicidal ideation.  Patients presenting with no risk factors but with morbid ruminations; may be classified as minimal risk based on the severity of the depressive symptoms  PLAN OF CARE:   44 yo male with a h/o schizophrenia spectrum disorder (schizoaffective and schizophrenia mentioned in chart), SIPD, SIMD, polysubstance abuse who presents to the ER with SI with a plan to shoot himself. UDS+cocaine, BZD. Etoh 222.Per chart, patient was recently released from incarceration and has been unable to get his medications since discharge. Pt reports home medications as haldol, thorazine, and remeron although is unable to provide further information about his medications. On chart review, one of patient's brothers is his guardian although guardianship paper work has yet to be received. On interview with Dr. Renaldo Fiddler and myself, patient noted to be irritable, labile and easily agitated resulting in early termination of interview to prevent escalation. Pt does report AVH although denies SI/HI on my assessment.  Please see H&P and attestation for full plan of care, but will restart haldol 10 BID, remeron 15 mg qhs, and thorazine 25 BID. Will monitor for effectiveness and titrate as clinically indicated/appropriate. Of note, patient signed 72 hour release @1153  AM today 11/10, patient to be discharged within 72 hours.    I certify that inpatient services furnished can reasonably be expected to improve the patient's condition.   13/10, MD 03/05/2020, 1:18 PM
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
## 2020-03-05 NOTE — Tx Team (Signed)
Interdisciplinary Treatment and Diagnostic Plan Update  03/05/2020 Time of Session: 9:05am Garrett Arellano MRN: [id number]  Principal Diagnosis: <principal problem not specified>  Secondary Diagnoses: Active Problems:   Substance induced mood disorder (HCC)   Current Medications:  Current Facility-Administered Medications  Medication Dose Route Frequency Provider Last Rate Last Admin  . acetaminophen (TYLENOL) tablet 650 mg  650 mg Oral Q6H PRN Connye Burkitt, NP      . alum & mag hydroxide-simeth (MAALOX/MYLANTA) 200-200-20 MG/5ML suspension 30 mL  30 mL Oral Q4H PRN Connye Burkitt, NP      . haloperidol (HALDOL) tablet 10 mg  10 mg Oral BID PRN Connye Burkitt, NP   10 mg at 03/04/20 1824   Or  . haloperidol lactate (HALDOL) injection 10 mg  10 mg Intramuscular BID PRN Connye Burkitt, NP      . hydrOXYzine (ATARAX/VISTARIL) tablet 25 mg  25 mg Oral Q6H PRN Connye Burkitt, NP   25 mg at 03/04/20 1824  . loperamide (IMODIUM) capsule 2-4 mg  2-4 mg Oral PRN Connye Burkitt, NP      . LORazepam (ATIVAN) tablet 1 mg  1 mg Oral Q6H PRN Connye Burkitt, NP      . magnesium hydroxide (MILK OF MAGNESIA) suspension 30 mL  30 mL Oral Daily PRN Connye Burkitt, NP      . multivitamin with minerals tablet 1 tablet  1 tablet Oral Daily Connye Burkitt, NP   1 tablet at 03/05/20 0816  . ondansetron (ZOFRAN-ODT) disintegrating tablet 4 mg  4 mg Oral Q6H PRN Connye Burkitt, NP      . thiamine tablet 100 mg  100 mg Oral Daily Connye Burkitt, NP   100 mg at 03/05/20 0816  . traZODone (DESYREL) tablet 50 mg  50 mg Oral QHS PRN,MR X 1 Connye Burkitt, NP       PTA Medications: Medications Prior to Admission  Medication Sig Dispense Refill Last Dose  . amantadine (SYMMETREL) 100 MG capsule Take 1 capsule (100 mg total) by mouth 2 (two) times daily. For EPS (Patient not taking: Reported on 09/13/2018)     . benztropine (COGENTIN) 1 MG tablet Take 1 mg by mouth 2 (two) times daily.     . chlorproMAZINE  (THORAZINE) 25 MG/ML injection Inject 1 mL (25 mg total) into the muscle 3 (three) times daily as needed (severe anxiety/agitation). (Patient not taking: Reported on 09/13/2018) 1 mL 0   . chlorproMAZINE (THORAZINE) 50 MG tablet Take 1 tablet (50 mg total) by mouth 3 (three) times daily. For mood control (Patient not taking: Reported on 09/13/2018)     . cloNIDine (CATAPRES) 0.1 MG tablet Take 1 tablet (0.1 mg total) by mouth 3 (three) times daily. For high blood pressure (Patient not taking: Reported on 09/13/2018) 1 tablet 0   . haloperidol (HALDOL) 20 MG tablet Take 20 mg by mouth 2 (two) times daily.     Marland Kitchen lamoTRIgine (LAMICTAL) 200 MG tablet Take 200 mg by mouth at bedtime.     . mirtazapine (REMERON) 7.5 MG tablet Take 7.5 mg by mouth at bedtime.       Patient Stressors: Health problems Medication change or noncompliance  Patient Strengths: Agricultural engineer for treatment/growth Physical Health Supportive family/friends  Treatment Modalities: Medication Management, Group therapy, Case management,  1 to 1 session with clinician, Psychoeducation, Recreational therapy.   Physician Treatment Plan for Primary Diagnosis: <principal problem not specified>  Long Term Goal(s):     Short Term Goals:    Medication Management: Evaluate patient's response, side effects, and tolerance of medication regimen.  Therapeutic Interventions: 1 to 1 sessions, Unit Group sessions and Medication administration.  Evaluation of Outcomes: Not Met  Physician Treatment Plan for Secondary Diagnosis: Active Problems:   Substance induced mood disorder ([place])  Long Term Goal(s):     Short Term Goals:       Medication Management: Evaluate patient's response, side effects, and tolerance of medication regimen.  Therapeutic Interventions: 1 to 1 sessions, Unit Group sessions and Medication administration.  Evaluation of Outcomes: Not Met   RN Treatment Plan for Primary Diagnosis: <principal  problem not specified> Long Term Goal(s): Knowledge of disease and therapeutic regimen to maintain health will improve  Short Term Goals: Ability to remain free from injury will improve, Ability to verbalize frustration and anger appropriately will improve, Ability to identify and develop effective coping behaviors will improve and Compliance with prescribed medications will improve  Medication Management: RN will administer medications as ordered by provider, will assess and evaluate patient's response and provide education to patient for prescribed medication. RN will report any adverse and/or side effects to prescribing provider.  Therapeutic Interventions: 1 on 1 counseling sessions, Psychoeducation, Medication administration, Evaluate responses to treatment, Monitor vital signs and CBGs as ordered, Perform/monitor CIWA, COWS, AIMS and Fall Risk screenings as ordered, Perform wound care treatments as ordered.  Evaluation of Outcomes: Not Met   LCSW Treatment Plan for Primary Diagnosis: <principal problem not specified> Long Term Goal(s): Safe transition to appropriate next level of care at discharge, Engage patient in therapeutic group addressing interpersonal concerns.  Short Term Goals: Engage patient in aftercare planning with referrals and resources, Increase social support, Identify triggers associated with mental health/substance abuse issues and Increase skills for wellness and recovery  Therapeutic Interventions: Assess for all discharge needs, 1 to 1 time with Social worker, Explore available resources and support systems, Assess for adequacy in community support network, Educate family and significant other(s) on suicide prevention, Complete Psychosocial Assessment, Interpersonal group therapy.  Evaluation of Outcomes: Not Met   Progress in Treatment: Attending groups: No. Participating in groups: No. Taking medication as prescribed: Yes. Toleration medication:  Yes. Family/Significant other contact made: No, will contact:  if consent is provided  Patient understands diagnosis: Yes. Discussing patient identified problems/goals with staff: Yes. Medical problems stabilized or resolved: Yes. Denies suicidal/homicidal ideation: Yes. Issues/concerns per patient self-inventory: No.   New problem(s) identified: No, Describe:  none  New Short Term/Long Term Goal(s): medication stabilization, elimination of SI thoughts, development of comprehensive mental wellness plan.   Patient Goals:  "To better myself"  Discharge Plan or Barriers: Patient recently admitted. CSW will continue to follow and assess for appropriate referrals and possible discharge planning.   Reason for Continuation of Hospitalization: Aggression Delusions  Hallucinations Medication stabilization  Estimated Length of Stay: 3-5 days  Attendees: Patient: Garrett Arellano 03/05/2020 9:23 AM  Physician: Ernie Hew, MD 03/05/2020 9:23 AM  Nursing:  03/05/2020 9:23 AM  RN Care Manager: 03/05/2020 9:23 AM  Social Worker: Darletta Moll, LCSW 03/05/2020 9:23 AM  Recreational Therapist:  03/05/2020 9:23 AM  Other:  03/05/2020 9:23 AM  Other:  03/05/2020 9:23 AM  Other: 03/05/2020 9:23 AM    Scribe for Treatment Team: Vassie Moselle, LCSW 03/05/2020 9:23 AM

## 2020-03-05 NOTE — [facility] Counselor (Signed)
Adult Comprehensive Assessment  Patient ID: Garrett Arellano, male   DOB: Jun 06, 1975, 44 y.o.   MRN: [id number]  Information Source: Information source: Patient  Current Stressors:  Patient states their primary concerns and needs for treatment are:: "Thoughts when not on medicine of hurting myself" Patient states their goals for this hospitilization and ongoing recovery are:: "To get back on medicine" Educational / Learning stressors: Denies stressor Employment / Job issues: Denies stressor Family Relationships: Denies Metallurgist / Lack of resources (include bankruptcy): Receives disability income Housing / Lack of housing: Denies stressor Physical health (include injuries & life threatening diseases): Denies stressor Social relationships: Denies stressor Substance abuse: Denies stressor Bereavement / Loss: Mother passed away 7 years ago. does not feel that he has completeley processed this loss  Living/Environment/Situation:  Living Arrangements: Other relatives Living conditions (as described by patient or guardian): "Good" Who else lives in the home?: Brother How long has patient lived in current situation?: "Since we were kids" What is atmosphere in current home: Comfortable  Family History:  Marital status: Single Are you sexually active?: Yes What is your sexual orientation?: straight Has your sexual activity been affected by drugs, alcohol, medication, or emotional stress?: Denies Does patient have children?: Yes How many children?: 1 How is patient's relationship with their children?: 63 y.o. daughter. States she is "living the fast life"  Childhood History:  By whom was/is the patient raised?: Mother Additional childhood history information: Patient stated he had a good childhood. Description of patient's relationship with caregiver when they were a child: States he had a good relationship with his mother. When asked about his father patient became agitated and  stated he was not going to speak about him Patient's description of current relationship with people who raised him/her: Mother passed away How were you disciplined when you got in trouble as a child/adolescent?: Grounded Does patient have siblings?: Yes Number of Siblings: 3 Description of patient's current relationship with siblings: Has 2 brothers and 1 sister. Reported having a good relationship with all of them Did patient suffer any verbal/emotional/physical/sexual abuse as a child?: No Did patient suffer from severe childhood neglect?: No Has patient ever been sexually abused/assaulted/raped as an adolescent or adult?: No Was the patient ever a victim of a crime or a disaster?: No Witnessed domestic violence?: No Has patient been affected by domestic violence as an adult?: No  Education:  Highest grade of school patient has completed: 9th grade Currently a student?: No Learning disability?: Yes What learning problems does patient have?: UTA  Employment/Work Situation:   Employment situation: On disability Why is patient on disability: Patient became agitated when asked How long has patient been on disability: Since childhood Patient's job has been impacted by current illness: No What is the longest time patient has a held a job?: UTA Where was the patient employed at that time?: UTA Has patient ever been in the [employer]?: No  Financial Resources:   Surveyor, quantity resources: Writer Does patient have a Lawyer or guardian?: No  Alcohol/Substance Abuse:   What has been your use of drugs/alcohol within the last 12 months?: Denies all substance use If attempted suicide, did drugs/alcohol play a role in this?: No Alcohol/Substance Abuse Treatment Hx: Denies past history Has alcohol/substance abuse ever caused legal problems?: No  Social Support System:   Forensic psychologist System: None Describe Community Support System: "I support my motherf*cking  self" Type of faith/religion: UTA How does patient's faith help to cope with  current illness?: n/a  Leisure/Recreation:   Do You Have Hobbies?: Yes Leisure and Hobbies: "Being by myself"  Strengths/Needs:   What is the patient's perception of their strengths?: UTA Patient states they can use these personal strengths during their treatment to contribute to their recovery: UTA Patient states these barriers may affect/interfere with their treatment: None Patient states these barriers may affect their return to the community: None Other important information patient would like considered in planning for their treatment: None  Discharge Plan:   Currently receiving community mental health services: Yes (From Whom) Vesta Mixer) Patient states concerns and preferences for aftercare planning are: States he haas not been seen at [facility] for sometime and is interested in getting services started again Patient states they will know when they are safe and ready for discharge when: UTA Does patient have access to transportation?: Yes Does patient have financial barriers related to discharge medications?: No Patient description of barriers related to discharge medications: n/a Will patient be returning to same living situation after discharge?: Yes  Summary/Recommendations:   Summary and Recommendations (to be completed by the evaluator): Garrett Arellano is an 44 y.o. male with a history of alcohol use disorder, cocaine use disorder, cannabis use disorder, aggressive behavior, schizoaffective disorder, and substance-induced anxiety disorder, and acute psychosis who presents to the emergency department by EMS. During assessment patient was emotionally labile and often became agitated. Patient states he lives with his brother and has received services through [facility] in the past.  While here, Garrett Arellano can benefit from crisis stabilization, medication management, therapeutic milieu, and referrals for  services.  Garrett Arellano A Jocilynn Grade. 03/05/2020

## 2020-03-05 NOTE — Progress Notes (Signed)
   03/05/20 0645  Psych Admission Type (Psych Patients Only)  Admission Status Voluntary  Psychosocial Assessment  Patient Complaints None  Eye Contact Fair  Facial Expression Other (Comment) (appropriate)  Affect Appropriate to circumstance  Speech Logical/coherent;Soft  Interaction Assertive  Motor Activity Other (Comment) (wnl)  Appearance/Hygiene In scrubs  Behavior Characteristics Cooperative  Mood Other (Comment) (appropriate)  Thought Process  Coherency Concrete thinking  Content WDL  Delusions WDL  Hallucination None reported or observed  Judgment Limited  Confusion UTA  Danger to Self  Current suicidal ideation? Denies  Danger to Others  Danger to Others None reported or observed   Pt slept the entire shift. Denies any issues this morning.

## 2020-03-05 NOTE — [facility] Suicide Risk Assessment (Signed)
[facility] INPATIENT:  Family/Significant Other Suicide Prevention Education  Suicide Prevention Education: Education Completed; brother Garrett Arellano [phone]) and Garrett Arellano [phone]), has been identified by the patient as the family member/significant other with whom the patient will be residing, and identified as the person(s) who will aid the patient in the event of a mental health crisis (suicidal ideations/suicide attempt).  With written consent from the patient, the family member/significant other has been provided the following suicide prevention education, prior to the and/or following the discharge of the patient.  The suicide prevention education provided includes the following:  Suicide risk factors  Suicide prevention and interventions  National Suicide Hotline telephone number  [facility] assessment telephone number  [facility] Emergency Assistance 911  [facility] and/or Residential Mobile Crisis Unit telephone number   Request made of family/significant other to:  Remove weapons (e.g., guns, rifles, knives), all items previously/currently identified as safety concern.    Remove drugs/medications (over-the-counter, prescriptions, illicit drugs), all items previously/currently identified as a safety concern.   The family member/significant other verbalizes understanding of the suicide prevention education information provided.  The family member/significant other agrees to remove the items of safety concern listed above.    CSW spoke with this patients brothers Garrett Arellano and Garrett Arellano. Per Garrett Arellano this patient had been living with him, however stated that he is now living in an apartment provided by Garrett Arellano. This patient was released from jail the previous Saturday and since being released has not been taking medications. They are interested in this patient being started on an injection due to concerns with medication adherence. Per Garrett Arellano he is this  patients payee and power of attorney, but is not his legal guardian. Per patients brothers he is able to return to the same living situation post discharge.   Ruthann Cancer MSW, LCSW Clincal Social Worker  [facility]

## 2020-03-05 NOTE — Progress Notes (Signed)
Pt has brother Murphy Duzan [phone]) listed as guardian but according to collateral information, brother Devesh Monforte ([phone]) has been guardian for the last 3 months.  SW will need to verify guardianship and request paperwork. Pt gives verbal consent to give both brothers the code number if and when they call. Pt also advised to call them this morning.

## 2020-03-05 NOTE — Progress Notes (Signed)
D: Patient denies SI/HI. Patient admitted to hearing and seeing people. Pt. Denies anxiety but rated depression 2/10. Pt. Angry and labile while having an EKG. Patient signed a 72 hour at 1153 today.  Pt. Later apologized to nurse for behavior during EKG. A:  Patient took scheduled medicine.  Support and encouragement provided Routine safety checks conducted every 15 minutes. Patient  Informed to notify staff with any concerns.   R:  Safety maintained.

## 2020-03-06 MED ORDER — HALOPERIDOL 5 MG PO TABS
15.0000 mg | ORAL_TABLET | Freq: Two times a day (BID) | ORAL | Status: DC
  Administered 2020-03-06 – 2020-03-07 (×2): 15 mg via ORAL
  Filled 2020-03-06 (×6): qty 3

## 2020-03-06 NOTE — Progress Notes (Signed)
Recreation Therapy Notes  Date: 11.11.21 Time: 1000 Location: 500 Hall Dayroom  Group Topic: Coping Skills  Goal Area(s) Addresses:  Patient will identify positive coping skills. Patient will identify benefits of using positive coping skills.  Intervention: Worksheet  Activity: Orthoptist.  Patients were to identify the things they felt have kept them from progressing and write them inside the web.  Patients were to then identify positive coping skills that could help them break free from the things holding them back.   Education: Pharmacologist, Building control surveyor.   Education Outcome: Acknowledges understanding/In group clarification offered/Needs additional education.   Clinical Observations/Feedback: Pt did not attend group session.    [name], LRT/CTRS    [name], Clint Biello A 03/06/2020 11:10 AM

## 2020-03-06 NOTE — Progress Notes (Signed)
   03/06/20 0626  Psych Admission Type (Psych Patients Only)  Admission Status Voluntary  Psychosocial Assessment  Patient Complaints None  Eye Contact Fair  Facial Expression Other (Comment) (appropriate)  Affect Appropriate to circumstance  Speech Logical/coherent;Soft  Interaction Assertive  Motor Activity Other (Comment) (wnl)  Appearance/Hygiene In scrubs  Behavior Characteristics Cooperative  Mood Other (Comment) (appropriate)  Thought Process  Coherency Concrete thinking  Content WDL  Delusions None reported or observed  Perception WDL  Hallucination None reported or observed  Judgment Limited  Confusion None  Danger to Self  Current suicidal ideation? Denies  Danger to Others  Danger to Others None reported or observed   Pt slept the entire shift. Pt refused blood work this morning. "They got blood up front yesterday. I'm not giving up no more blood."

## 2020-03-06 NOTE — [facility] Group Notes (Deleted)
The focus of this group is to help patients review their daily goal of treatment and discuss progress on daily workbooks.   
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
## 2020-03-06 NOTE — Progress Notes (Signed)
Recreation Therapy Notes  INPATIENT RECREATION THERAPY ASSESSMENT  Patient Details Name: Garrett Arellano MRN: [id number] DOB: 1975/07/11 Today's Date: 03/06/2020       Information Obtained From: Patient  Able to Participate in Assessment/Interview: Yes  Patient Presentation: Alert  Reason for Admission (Per Patient): Other (Comments) ("myself")  Patient Stressors: Other (Comment) (Hearing voices; seeing things; ran out of medication)  Coping Skills:   Isolation, Self-Injury, TV, Sports, Music, Exercise, Meditate, Deep Breathing, Impulsivity, Talk, Prayer, Avoidance, Read, Hot Bath/Shower  Leisure Interests (2+):  Individual - Other (Comment), Music - Listen (Meditate)  Frequency of Recreation/Participation: Other (Comment) (Daily)  Awareness of Community Resources:  Yes  Community Resources:  Research scientist (physical sciences)  Current Use: No  If no, Barriers?: Other (Comment) (COVID)  Expressed Interest in [employer] Information: No  County of Residence:  [place]  Patient Main Form of Transportation: Therapist, music  Patient Strengths:  Thinking  Patient Identified Areas of Improvement:  Being obediant  Patient Goal for Hospitalization:  "I should be leaving tomorrow or Saturday"  Current SI (including self-harm):  No  Current HI:  No  Current AVH: No  Staff Intervention Plan: Group Attendance, Collaborate with Interdisciplinary Treatment Team  Consent to Intern Participation: N/A   [name], LRT/CTRS  [name] A 03/06/2020, 1:58 PM

## 2020-03-06 NOTE — BHH Group Notes (Signed)
The focus of this group is to help patients establish daily goals to achieve during treatment and discuss how the patient can incorporate goal setting into their daily lives to aide in recovery.  Pt did not attend group 

## 2020-03-06 NOTE — BHH Group Notes (Signed)
  Adult Psychoeducational Group Note  Date:  03/06/2020 Time:  10:49 PM  Group Topic/Focus:  Wrap-Up Group:   The focus of this group is to help patients review their daily goal of treatment and discuss progress on daily workbooks.  Participation Level:  Active  Participation Quality:  Appropriate  Affect:  Appropriate  Cognitive:  Alert and Appropriate  Insight: Lacking  Engagement in Group:  Limited     Modes of Intervention:  Discussion    Additional Comments:      Jacalyn Lefevre 03/06/2020, 10:49 PMThe focus of this group is to help patients review their daily goal of treatment and discuss progress on daily workbooks.

## 2020-03-06 NOTE — BHH Group Notes (Signed)
Occupational Therapy Group Note Date: 03/06/2020 Group Topic/Focus: Goal-Setting and Socialization/Social Skills  Group Description: Group encouraged increased engagement and participation through discussion focused on goal-setting and age appropriate socialization. Patients were prompted to fill out a worksheet and respond to three prompts: one thing I want you to know about me is, the hardest thing I am dealing with today, and my goal for today. Discussion then focused on patients sharing their responses and engaging socially as they offered support to their peers.  Participation Level: Patient did not attend OT group session despite personal invitation.    Plan: Continue to engage patient in OT groups 2 - 3x/week.  03/06/2020  Donne Hazel, MOT, OTR/L

## 2020-03-06 NOTE — Progress Notes (Signed)
   03/06/20 0626  Vital Signs  Temp 97.7 F (36.5 C)  Temp Source Oral  Pulse Rate 97  Pulse Rate Source Monitor  BP (!) 143/102  BP Location Right Arm  BP Method Automatic  Patient Position (if appropriate) Sitting  Oxygen Therapy  SpO2 100 %   D: Patient denies SI/HI/AVH. Patient out in open areas and attended group. A:  Patient took scheduled medicine.  Support and encouragement provided Routine safety checks conducted every 15 minutes. Patient  Informed to notify staff with any concerns.  R: Safety maintained.

## 2020-03-07 DIAGNOSIS — F17200 Nicotine dependence, unspecified, uncomplicated: Secondary | ICD-10-CM | POA: Diagnosis present

## 2020-03-07 MED ORDER — HALOPERIDOL 10 MG PO TABS: 15.0000 mg | ORAL_TABLET | Freq: Two times a day (BID) | ORAL | 0 refills | Status: DC

## 2020-03-07 MED ORDER — CHLORPROMAZINE HCL 25 MG PO TABS: 25.0000 mg | ORAL_TABLET | Freq: Two times a day (BID) | ORAL | 0 refills | Status: DC

## 2020-03-07 MED ORDER — BENZTROPINE MESYLATE 1 MG PO TABS: 1.0000 mg | ORAL_TABLET | Freq: Two times a day (BID) | ORAL | 0 refills | Status: DC | PRN

## 2020-03-07 MED ORDER — MIRTAZAPINE 15 MG PO TABS: 15.0000 mg | ORAL_TABLET | Freq: Every day | ORAL | 0 refills | Status: DC

## 2020-03-07 NOTE — B[facility] Suicide Risk Assessment (Signed)
[facility] Discharge Suicide Risk Assessment   Principal Problem: Substance induced mood disorder (HCC) Discharge Diagnoses: Principal Problem:   Substance induced mood disorder (HCC) Active Problems:   Schizophrenia, unspecified type (HCC)   Suicidal ideation   Alcohol abuse   Cocaine abuse with cocaine-induced mood disorder (HCC)   Tobacco use disorder   Total Time spent with patient: 35 min   Patient is seen and evaluated.  He reports that he came voluntarily to the hospital to get restarted on medications.  He had previously been in prison and had been discharged without medications.  He does have a history of substance use disorder and had been using cocaine prior to arriving to the emergency department.  Patient reported suicidal ideation at his arrival to the emergency department.  Since being hospitalized, he has been restarted on home medications to include Thorazine, Haldol, and Remeron (dose increased during hospital stay for symptoms of depression). Today, patient reports his mood is good.  He is denying suicidal or homicidal ideation, plan or intent.  He has a close relationship with his brothers and has created a safety plan for discharge.  He denies access to weapons.  He does not have any auditory or visual hallucinations.  Musculoskeletal: Strength & Muscle Tone: within normal limits Gait & Station: normal Patient leans: N/A  Psychiatric Specialty Exam: Review of Systems  Constitutional: Negative.   Respiratory: Negative.   Cardiovascular: Negative.   Gastrointestinal: Negative.   Musculoskeletal: Negative.   Neurological: Negative.   Psychiatric/Behavioral: Negative for agitation, behavioral problems, confusion, decreased concentration, dysphoric mood, hallucinations, self-injury, sleep disturbance and suicidal ideas. The patient is not nervous/anxious and is not hyperactive.     Blood pressure (!) 158/73, pulse (!) 133, temperature 97.9 F (36.6 C), temperature source  Oral, resp. rate 16, height 6' 1.23" (1.86 m), weight 95.3 kg, SpO2 100 %.Body mass index is 27.53 kg/m.  General Appearance: Casual and Neat  Eye Contact::  Good  Speech:  Clear and Coherent and Normal Rate409  Volume:  Normal  Mood:  Euthymic  Affect:  Appropriate and Congruent  Thought Process:  Coherent and Descriptions of Associations: Intact  Orientation:  Full (Time, Place, and Person)  Thought Content:  Logical  Suicidal Thoughts:  No  Homicidal Thoughts:  No  Memory:  Immediate;   Good Recent;   Good Remote;   Good  Judgement:  Fair  Insight:  Fair  Psychomotor Activity:  Normal  Concentration:  Good  Recall:  Good  Fund of Knowledge:Good  Language: Good  Akathisia:  No  Handed:  Right  AIMS (if indicated):   0  Assets:  Communication Skills Desire for Improvement Housing Physical Health Resilience Social Support  Sleep:  Number of Hours: 7.75  Cognition: WNL  ADL's:  Intact   Mental Status Per Nursing Assessment::   On Admission:  Suicidal ideation indicated by patient, Plan includes specific time, place, or method, Intention to act on suicide plan, Suicidal ideation indicated by others, Self-harm thoughts, Belief that plan would result in death, Suicide plan  Demographic Factors:  Male, Living alone and Unemployed  Loss Factors: Recent release from prison  Historical Factors: Family history of mental illness or substance abuse, Impulsivity and history of self harm  Risk Reduction Factors:   Sense of responsibility to family, Positive social support, Positive therapeutic relationship and Positive coping skills or problem solving skills  Continued Clinical Symptoms:  Depression:   Comorbid alcohol abuse/dependence Alcohol/Substance Abuse/Dependencies  Cognitive Features That Contribute To  Risk:  None    Suicide Risk:  Minimal: No identifiable suicidal ideation.  Patients presenting with no risk factors but with morbid ruminations; may be classified  as minimal risk based on the severity of the depressive symptoms   Follow-up Information    [facility] Follow up.   Why: ACT team member will come to see you on Thursday or Friday -11/18 or 11/19 at your apartment to complete intake for services.  Contact information: [address] [place] [address] [phone]               Plan Of Care/Follow-up recommendations:  Activity:  ad lib Diet:  as tolerated   On day of discharge following sustained improvement in the affect of this patient, continued report of euthymic mood, repeated denial of suicidal, homicidal, and other violent ideation, adequate interaction with peers, active participation in groups while on the unit, and denial of adverse reactions from medications, the treatment team decided Garrett Arellano was stable for discharge home with scheduled mental health treatment as noted above.  He was able to engage in safety planning including plan to return to [facility] or contact emergency services if he feels unable to maintain his own safety or the safety of others. Patient had no further questions, comments, or concerns. Discharge into care of brother, who agrees to maintain patient safety.  Patient aware to return to nearest crisis center, ED or to call 911 for worsening symptoms of depression, suicidal or homicidal thoughts or AVH.    Mariel Craft, MD 03/07/2020, 1:02 PM

## 2020-03-07 NOTE — Progress Notes (Signed)
   03/07/20 0052  Psych Admission Type (Psych Patients Only)  Admission Status Voluntary  Psychosocial Assessment  Patient Complaints None  Eye Contact Fair  Facial Expression Other (Comment) (appropriate)  Affect Appropriate to circumstance  Speech Logical/coherent;Soft  Interaction Assertive  Motor Activity Other (Comment) (wnl)  Appearance/Hygiene In scrubs  Behavior Characteristics Cooperative  Mood Anxious  Thought Process  Coherency Concrete thinking  Content WDL  Delusions None reported or observed  Perception WDL  Hallucination None reported or observed  Judgment Limited  Confusion None  Danger to Self  Current suicidal ideation? Denies  Danger to Others  Danger to Others None reported or observed  D: Patient in dayroom reports he had a good day. Pt reports tolerating medication well. Pt denies any needs  A: Medications administered as prescribed. Support and encouragement provided as needed.  R: Patient remains safe on the unit. Will continue to monitor for safety and stability.

## 2020-03-07 NOTE — BHH Group Notes (Signed)
B[facility] LCSW Group Therapy  03/07/2020 2:05 PM  Type of Therapy:  Group Therapy  Participation Level:  Minimal  Participation Quality:  Limited  Engagement in Therapy:  None  Modes of Intervention:  Discussion and Education  Summary of Progress/Problems: Pt attended but did not participate or share.  Felizardo Hoffmann 03/07/2020, 2:05 PM

## 2020-03-07 NOTE — BHH Group Notes (Signed)
[facility] LCSW Group Therapy  03/07/2020 1:06 PM  Type of Therapy and Topic: Group Therapy: Anger Management   Description of Group: In this group, patients will learn helpful strategies and techniques to manage anger, express anger in alternative ways, change hostile attitudes, and prevent aggressive acts, such as verbal abuse and violence.This group will be process-oriented and eductional, with patients participating in exploration of their own experiences as well as giving and receiving support and challenge from other group members.  Therapeutic Goals: 1. Patient will learn to manage anger. 2. Patient will learn to stop violence or the threat of violence. 3. Patient will learn to develop self control over thoughts and actions. 4. Patient will receive support and feedback from others  Therapeutic Modalities: Cognitive Behavioral Therapy Solution Focused Therapy Motivational Interviewing   Participation Level:  Active   Summary of Progress/Problems: Patient attended and participated in group. Patient shared that being betrayed by someone he trusts causes him to get angry.   Jakell Trusty A Javae Braaten 03/07/2020, 1:06 PM

## 2020-03-07 NOTE — Progress Notes (Signed)
  [facility] Adult Case Management Discharge Plan :  Will you be returning to the same living situation after discharge:  Yes,  to home At discharge, do you have transportation home?: Yes,  brother is to pick him up Do you have the ability to pay for your medications: Yes,  has insurance  Release of information consent forms completed and in the chart;  Patient's signature needed at discharge.  Patient to Follow up at:  Follow-up Information    [facility] Follow up.   Why: ACT team member will come to see you on Thursday or Friday -11/18 or 11/19 at your apartment to complete intake for services.  Contact information: [address] [place] [address] [phone]               Next level of care provider has access to [facility] Link:no  Safety Planning and Suicide Prevention discussed: Yes,  with brothers     Has patient been referred to the Quitline?: Patient refused referral  Patient has been referred for addiction treatment: N/A  Otelia Santee, LCSW 03/07/2020, 11:51 AM
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
## 2020-03-07 NOTE — Progress Notes (Signed)
Pt discharged to lobby. Pt was stable and appreciative at that time. All papers and prescriptions were given and valuables returned. Verbal understanding expressed. Denies SI/HI and A/VH. Pt given opportunity to express concerns and ask questions.  
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
## 2020-10-20 ENCOUNTER — Other Ambulatory Visit: Payer: Self-pay

## 2020-10-20 ENCOUNTER — Emergency Department (HOSPITAL_COMMUNITY)
Admission: EM | Admit: 2020-10-20 | Discharge: 2020-10-22 | Disposition: A | Discharge status: Home / Self Care | Payer: Medicaid Other | Attending: Emergency Medicine | Admitting: Emergency Medicine

## 2020-10-20 ENCOUNTER — Encounter (HOSPITAL_COMMUNITY): Payer: Self-pay

## 2020-10-20 DIAGNOSIS — F209 Schizophrenia, unspecified: Secondary | ICD-10-CM | POA: Diagnosis present

## 2020-10-20 DIAGNOSIS — F17210 Nicotine dependence, cigarettes, uncomplicated: Secondary | ICD-10-CM | POA: Insufficient documentation

## 2020-10-20 DIAGNOSIS — Z20822 Contact with and (suspected) exposure to covid-19: Secondary | ICD-10-CM | POA: Insufficient documentation

## 2020-10-20 DIAGNOSIS — R45851 Suicidal ideations: Secondary | ICD-10-CM | POA: Insufficient documentation

## 2020-10-20 DIAGNOSIS — F23 Brief psychotic disorder: Secondary | ICD-10-CM | POA: Insufficient documentation

## 2020-10-20 DIAGNOSIS — Y900 Blood alcohol level of less than 20 mg/100 ml: Secondary | ICD-10-CM | POA: Insufficient documentation

## 2020-10-20 DIAGNOSIS — R42 Dizziness and giddiness: Secondary | ICD-10-CM | POA: Diagnosis not present

## 2020-10-20 LAB — RAPID URINE DRUG SCREEN, HOSP PERFORMED
Amphetamines: NOT DETECTED
Barbiturates: NOT DETECTED
Benzodiazepines: NOT DETECTED
Cocaine: POSITIVE — AB
Opiates: NOT DETECTED
Tetrahydrocannabinol: NOT DETECTED

## 2020-10-20 LAB — RESP PANEL BY RT-PCR (FLU A&B, COVID) ARPGX2
Influenza A by PCR: NEGATIVE
Influenza B by PCR: NEGATIVE
SARS Coronavirus 2 by RT PCR: NEGATIVE

## 2020-10-20 MED ORDER — LORAZEPAM 1 MG PO TABS
1.0000 mg | ORAL_TABLET | Freq: Once | ORAL | Status: AC
  Administered 2020-10-20: 1 mg via ORAL
  Filled 2020-10-20: qty 1

## 2020-10-20 NOTE — BH Assessment (Addendum)
Comprehensive Clinical Assessment (CCA) Note  10/20/2020 Garrett Arellano [id number]  DISPOSITION: Per Clementeen Hoof NP patient recommends continuous assessment.     Flowsheet Row ED from 10/20/2020 in [facility] Ad[facility] ([facility]) [facility] from 03/02/2020 in [facility]  C-SSRS RISK CATEGORY High Risk High Risk High Risk       The patient demonstrates the following risk factors for suicide: Chronic risk factors for suicide include: substance use disorder. Acute risk factors for suicide include: family or marital conflict. Protective factors for this patient include: positive social support. Considering these factors, the overall suicide risk at this point appears to be moderate. Patient is not appropriate for outpatient follow up.    Garrett Arellano is an 44 y.o. male with a history of cocaine use disorder, aggressive behavior, schizoaffective disorder, and substance-induced anxiety disorder, and acute psychosis who presents to the emergency department with ongoing S/I this date voicing multiple plans to self harm. Patient denies any H/I although reports ongoing AVH although is vague in reference to symptoms. Patient rendered limited history and is observed to be drowsy. Patient states he was at a local gas station and started feeling bad and when EMS arrived patient voiced S/I. He states he mother recently passed away and "just can't get over it." Patient renders conflicting history stating he has OP services then later denies. Patient denies any SA use although UDS is positive for cocaine. Patient has a past history of schizophrenia and SA issues per chart review. Patient was last seen in 2021 when he presented with similar symptoms.He declined to provide any insight on his stressors and/or triggers. Denies current legal issues. Patient has a extensive history of cocaine and cannabis  use. Patient reports prior hospitalizations although is vague in reference to facilities. He was apprehensive in providing information about his last hospitalization and is very guarded. Patient per chart review has received services from [facility] in the past.    EDP writes on arrival: 45 year old male with history of alcohol abuse, bipolar disorder, schizophrenia, presenting to the ED with suicidal ideation.  He was reportedly at a [employer] station and asked attendant to call 911 as he was feeling dizzy, however upon EMS arrival he reported that he was suicidal.  He states his mother recently passed away and he has been having a hard time dealing with this.  He is not current taking his psychiatric medications and cannot tell me the last time he took them consistently.  Denies EtOH or drug use today.  Patient will not respond to orientation questions. Patient appears to be disorganized and renders limited history. Patient does not appear to be responding to internal stimuli.      Chief Complaint:  Chief Complaint  Patient presents with   Suicidal   Visit Diagnosis: Schizophrenia     CCA Screening, Triage and Referral (STR)  Patient Reported Information How did you hear about [place]? Self  What Is the Reason for Your Visit/Call Today? Ongoing S/I  How Long Has This Been Causing You Problems? 1 wk - 1 month  What Do You Feel Would Help You the Most Today? Medication(s)   Have You Recently Had Any Thoughts About Hurting Yourself? Yes  Are You Planning to Commit Suicide/Harm Yourself At This time? No   Have you Recently Had Thoughts About Hurting Someone Garrett Arellano? No  Are You Planning to Harm Someone at This Time? No  Explanation:  No data recorded  Have You Used Any Alcohol or Drugs in the Past 24 Hours? Yes  How Long Ago Did You Use Drugs or Alcohol? No data recorded What Did You Use and How Much? Ongoing cocaine use   Do You Currently Have a Therapist/Psychiatrist? No  Name of  Therapist/Psychiatrist: No data recorded  Have You Been Recently Discharged From Any Office Practice or Programs? No  Explanation of Discharge From Practice/Program: No data recorded    CCA Screening Triage Referral Assessment Type of Contact: Face-to-Face  Telemedicine Service Delivery:   Is this Initial or Reassessment? No data recorded Date Telepsych consult ordered in [facility]:   (03/03/2020)  Time Telepsych consult ordered in [facility]:  No data recorded Location of Assessment: [facility]  Provider Location: Other (comment) ([facility])   Collateral Involvement: NA   Does Patient Have a Automotive engineerCourt Appointed Legal Guardian? No data recorded Name and Contact of Legal Guardian: -- (per patient, Garrett Arellano [phone])  If Minor and Not Living with Parent(s), Who has Custody? NA  Is CPS involved or ever been involved? Never  Is APS involved or ever been involved? Never   Patient Determined To Be At Risk for Harm To Self or Others Based on Review of Patient Reported Information or Presenting Complaint? Yes, for Self-Harm  Method: No data recorded Availability of Means: No data recorded Intent: No data recorded Notification Required: No data recorded Additional Information for Danger to Others Potential: No data recorded Additional Comments for Danger to Others Potential: No data recorded Are There Guns or Other Weapons in Your Home? No data recorded Types of Guns/Weapons: No data recorded Are These Weapons Safely Secured?                            No data recorded Who Could Verify You Are Able To Have These Secured: No data recorded Do You Have any Outstanding Charges, Pending Court Dates, Parole/Probation? No data recorded Contacted To Inform of Risk of Harm To Self or Others: Other: Comment (NA)    Does Patient Present under Involuntary Commitment? No  IVC Papers Initial File Date: No data recorded  [place]   Patient Currently Receiving the Following  Services: Medication Management   Determination of Need: Urgent (48 hours)   Options For Referral: Outpatient Therapy     CCA Biopsychosocial Patient Reported Schizophrenia/Schizoaffective Diagnosis in Past: Yes   Strengths: Pt is willing to participate in treatment   Mental Health Symptoms Depression:   Change in energy/activity   Duration of Depressive symptoms:  Duration of Depressive Symptoms: Greater than two weeks   Mania:   None   Anxiety:    Difficulty concentrating   Psychosis:   Delusions   Duration of Psychotic symptoms:  Duration of Psychotic Symptoms: Less than six months   Trauma:   None   Obsessions:   None   Compulsions:   None   Inattention:   None   Hyperactivity/Impulsivity:   None   Oppositional/Defiant Behaviors:   None   Emotional Irregularity:   Chronic feelings of emptiness   Other Mood/Personality Symptoms:   NA    Mental Status Exam Appearance and self-care  Stature:   Average   Weight:   Average weight   Clothing:   Neat/clean   Grooming:   Normal   Cosmetic use:   None   Posture/gait:   Normal   Motor activity:   Not Remarkable  Sensorium  Attention:   Distractible   Concentration:   Anxiety interferes   Orientation:   X5   Recall/memory:   Normal   Affect and Mood  Affect:   Anxious   Mood:   Depressed; Anxious   Relating  Eye contact:   Fleeting   Facial expression:   Anxious   Attitude toward examiner:   Cooperative   Thought and Language  Speech flow:  Soft   Thought content:   Suspicious   Preoccupation:   None   Hallucinations:   Auditory   Organization:  No data recorded  [employer] of Knowledge:   Fair   Intelligence:   Average   Abstraction:   Functional   Judgement:   Fair   Programmer, systems   Insight:   Fair   Decision Making:   Normal   Social Functioning  Social Maturity:   Responsible   Social  Judgement:   Normal   Stress  Stressors:   Housing   Coping Ability:   Normal   Skill Deficits:   Activities of daily living   Supports:   Usual     Religion: Religion/Spirituality Are You A Religious Person?: No  Leisure/Recreation: Leisure / Recreation Do You Have Hobbies?: No  Exercise/Diet: Exercise/Diet Do You Exercise?: No Have You Gained or Lost A Significant Amount of Weight in the Past Six Months?: No Do You Follow a Special Diet?: No Do You Have Any Trouble Sleeping?: No   CCA Employment/Education Employment/Work Situation: Employment / Work Situation Employment Situation: Unemployed Has Patient ever Been in Equities trader?: No  Education: Education Is Patient Currently Attending School?: No Did Theme park manager?: No Did You Have An Individualized Education Program (IIEP): No Did You Have Any Difficulty At [employer]?: No Patient's Education Has Been Impacted by Current Illness: No   CCA Family/Childhood History Family and Relationship History: Family history Marital status: Single Does patient have children?: No  Childhood History:  Childhood History Did patient suffer any verbal/emotional/physical/sexual abuse as a child?: No Did patient suffer from severe childhood neglect?: No Has patient ever been sexually abused/assaulted/raped as an adolescent or adult?: No Was the patient ever a victim of a crime or a disaster?: No Witnessed domestic violence?: No Has patient been affected by domestic violence as an adult?: No  Child/Adolescent Assessment:     CCA Substance Use Alcohol/Drug Use: Alcohol / Drug Use Pain Medications: See MAR Prescriptions: See MAR Over the Counter: See MAR History of alcohol / drug use?: Yes Longest period of sobriety (when/how long): Unknown Withdrawal Symptoms: None Substance #1 Name of Substance 1: Cocaine per UDS 1 - Age of First Use: UTA 1 - Amount (size/oz): UTA 1 - Frequency: UTA 1 - Duration: UTA 1  - Last Use / Amount: UDS post this date 1 - Method of Aquiring: UTA 1- Route of Use: UTA                       ASAM's:  Six Dimensions of Multidimensional Assessment  Dimension 1:  Acute Intoxication and/or Withdrawal Potential:   Dimension 1:  Description of individual's past and current experiences of substance use and withdrawal: 2  Dimension 2:  Biomedical Conditions and Complications:   Dimension 2:  Description of patient's biomedical conditions and  complications: 2  Dimension 3:  Emotional, Behavioral, or Cognitive Conditions and Complications:  Dimension 3:  Description of emotional, behavioral, or cognitive conditions and complications:  2  Dimension 4:  Readiness to Change:  Dimension 4:  Description of Readiness to Change criteria: 2  Dimension 5:  Relapse, Continued use, or Continued Problem Potential:  Dimension 5:  Relapse, continued use, or continued problem potential critiera description: 2  Dimension 6:  Recovery/Living Environment:  Dimension 6:  Recovery/Iiving environment criteria description: 2  ASAM Severity Score: ASAM's Severity Rating Score: 12  ASAM Recommended Level of Treatment:     Substance use Disorder (SUD)    Recommendations for Services/Supports/Treatments:    Discharge Disposition:    DSM5 Diagnoses: Patient Active Problem List   Diagnosis Date Noted   Tobacco use disorder 03/07/2020   Acute alcoholic intoxication without complication (HCC)    Cocaine abuse with cocaine-induced mood disorder (HCC) 08/01/2016   Aggressive behavior    Alcohol use disorder, severe, dependence (HCC) 07/14/2016   Cocaine use disorder, severe, dependence (HCC) 07/14/2016   Cannabis use disorder, severe, dependence (HCC) 07/14/2016   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) 07/14/2016   Alcohol abuse 04/26/2016   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

## 2020-10-20 NOTE — ED Triage Notes (Signed)
[place] [employer] where he told someone to call 911 because he was feeling sick and dizzy.  When EMS arrived pt reported that he feels suicidal, states mother died recently.  Pt with hx of SI.  Hx of bipolar, adhd, schizophrenia.  Last vitals in route per EMS; 138/108, 98% on ra, 88 HR, 14 RR, CBG 112.

## 2020-10-20 NOTE — ED Provider Notes (Signed)
Patient signed out to me by M. Hyman Hopes, PA-C.  Please see previous notes for their history.  In brief, patient presented for evaluation of suicidal thoughts which worsened after the death of his mother.  UDS positive for cocaine.  Labs unable to be obtained due to patient being a difficult stick and not cooperating.  Awaiting behavioral health evaluation for possible transfer to [facility].   Behavioral health tem evaluated the pt. Recommends continued obvs and pt is still reporting SI. Will try to get pt transferred to [facility].   [facility] refusing pt as he meets inpatient criteria.   Pt continues to refuse labs. Ativan given to assist with this.    Alveria Apley, PA-C 10/20/20 2339    Tilden Fossa, MD 10/21/20 1515
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
## 2020-10-20 NOTE — ED Provider Notes (Signed)
Care assumed from Sharilyn Sites, PA-C, at shift change, please see their notes for full documentation of patient's complaint/HPI. Briefly, pt here with complaints of suicidal ideation after the death of his mother. Results so far show UDS positive for cocaine. Difficult stick/no labs currently. Awaiting TTS eval/possible transfer to [facility] at 8 AM. Plan is to touch base with [facility] when they open to determine if he needs TTS eval here or there.   Physical Exam  BP (!) 151/100   Pulse 83   Temp 97.8 F (36.6 C) (Oral)   Resp 17   Ht 6' (1.829 m)   Wt 95 kg   SpO2 100%   BMI 28.40 kg/m   Physical Exam Vitals and nursing note reviewed.  Constitutional:      Appearance: He is not ill-appearing.  HENT:     Head: Normocephalic and atraumatic.  Eyes:     Conjunctiva/sclera: Conjunctivae normal.  Cardiovascular:     Rate and Rhythm: Normal rate and regular rhythm.  Pulmonary:     Effort: Pulmonary effort is normal.     Breath sounds: Normal breath sounds.  Skin:    General: Skin is warm and dry.     Coloration: Skin is not jaundiced.  Neurological:     Mental Status: He is alert.    ED Course/Procedures     Procedures  MDM  8:07 AM Attempted to contact Assunta Found, NP for [facility]. Reports she is not in the office at this time and will contact me back when she arrives to discuss possible transfer to [facility]. Awaiting call.   I had directly messaged Shuvon who read my message however did not respond.   I was informed by attending physician Dr. Delford Field who spoke with [facility] provider that 2 providers would be coming to evaluate all TTS patients. Will await their recommendations.   2:57 PM Pt still has not been evaluated by TTS.   Spoke with Pollyann Glen who reports continuous observation for now. He will try to see if patient can go to [facility] which I think is appropriate. Case signed out to oncoming ED provider Sophia Caccavale PA-C.      Tanda Rockers, PA-C 10/20/20 1514     Koleen Distance, MD 10/20/20 [phone]

## 2020-10-20 NOTE — ED Notes (Signed)
Pr refusing blood work after being stuck unsuccessfully x1

## 2020-10-20 NOTE — ED Notes (Signed)
Pt was changed into burgundy scrubs. Pt belonging bags were placed in the cabinet behind charge nurses station. Pt had 2 bags. Pt was wanded by security.

## 2020-10-21 DIAGNOSIS — R45851 Suicidal ideations: Secondary | ICD-10-CM

## 2020-10-21 LAB — COMPREHENSIVE METABOLIC PANEL WITH GFR
ALT: 15 U/L (ref 0–44)
AST: 14 U/L — ABNORMAL LOW (ref 15–41)
Albumin: 4.5 g/dL (ref 3.5–5.0)
Alkaline Phosphatase: 27 U/L — ABNORMAL LOW (ref 38–126)
Anion gap: 5 (ref 5–15)
BUN: 14 mg/dL (ref 6–20)
CO2: 29 mmol/L (ref 22–32)
Calcium: 10 mg/dL (ref 8.9–10.3)
Chloride: 104 mmol/L (ref 98–111)
Creatinine, Ser: 1.14 mg/dL (ref 0.61–1.24)
GFR, Estimated: >60 mL/min (ref >60)
Glucose, Bld: 76 mg/dL (ref 70–99)
Potassium: 4.6 mmol/L (ref 3.5–5.1)
Sodium: 138 mmol/L (ref 135–145)
Total Bilirubin: 0.7 mg/dL (ref 0.3–1.2)
Total Protein: 8 g/dL (ref 6.5–8.1)

## 2020-10-21 LAB — CBC
HCT: 47.1 % (ref 39.0–52.0)
Hemoglobin: 15.1 g/dL (ref 13.0–17.0)
MCH: 27.4 pg (ref 26.0–34.0)
MCHC: 32.1 g/dL (ref 30.0–36.0)
MCV: 85.5 fL (ref 80.0–100.0)
Platelets: 280 10*3/uL (ref 150–400)
RBC: 5.51 MIL/uL (ref 4.22–5.81)
RDW: 13.8 % (ref 11.5–15.5)
WBC: 4.7 10*3/uL (ref 4.0–10.5)
nRBC: 0 % (ref 0.0–0.2)

## 2020-10-21 LAB — ETHANOL: Alcohol, Ethyl (B): <10 mg/dL (ref <10)

## 2020-10-21 LAB — ACETAMINOPHEN LEVEL: Acetaminophen (Tylenol), Serum: <10 ug/mL — ABNORMAL LOW (ref 10–30)

## 2020-10-21 LAB — SALICYLATE LEVEL: Salicylate Lvl: <7 mg/dL — ABNORMAL LOW (ref 7.0–30.0)

## 2020-10-21 NOTE — Consult Note (Addendum)
Garrett Arellano, is 45 y.o male, seen by this provider face-to-face in [facility] emergency department.  Patient refused to identify self, kept repeating "I am not answering any stupid ass questions that you already know the answer to" he refused to raise up from the bed to look at this provider. Attitude extremely  hostile, not cooperative, extremely irritable and spoke with  voice loud.  Unable to perform thorough psychiatric evaluation. Case reviewed with Dr. Lucianne Muss, patient with recent loss, unable to contract for safety. Recommend inpatient psychiatric care. Information given to EDP, ED staff, and counselor for disposition planning. This patient is not psychiatrically cleared.

## 2020-10-21 NOTE — ED Notes (Signed)
Attempt to get blood drawn for labs but patient refused, PA Mia aware

## 2020-10-21 NOTE — ED Notes (Signed)
Patient received breakfast tray 

## 2020-10-21 NOTE — ED Notes (Signed)
Patient sleeping/rise and fall of chest breathing observed 

## 2020-10-21 NOTE — BH Assessment (Signed)
[facility] Assessment Progress Note   Per Dorena Bodo, NP, this pt requires psychiatric hospitalization at this time.  At the direction of Nelly Rout, MD this writer has sought placement for pt outside of the [facility].  The following facilities have been contacted to seek placement for this pt, with results as noted:  Beds available, information sent, decision pending: [facility]   At capacity:   If this voluntary pt is accepted to a facility, please discuss disposition with pt to be sure that he  agrees to the plan.  If a facility agrees to accept pt and the plan changes in any way please call the facility to inform them of the change.  Final disposition is pending as of this writing.  Doylene Canning, [place] Behavioral Health Coordinator [phone]

## 2020-10-22 NOTE — Discharge Instructions (Addendum)
For your behavioral health needs you are advised to continue treatment with the [facility]:       [facility]      [address]      [place], [place] [address]      [phone] 

## 2020-10-22 NOTE — ED Provider Notes (Signed)
  Physical Exam  BP (!) 146/88 (BP Location: Right Arm)   Pulse 87   Temp 98.6 F (37 C) (Oral)   Resp 16   Ht 6' (1.829 m)   Wt 95 kg   SpO2 100%   BMI 28.40 kg/m   Physical Exam  ED Course/Procedures     Procedures  MDM  Received care of patient from previous providers.  Please see history, physical report care.  Briefly this is a 45 year old male who is in the emergency department awaiting psychiatry evaluation.  Initially, psychiatry was planning on inpatient evaluation, however he has not been cooperative with psychiatric evaluation and treatment, and there is concern that he has been awaiting court (6/27 and 6/28) as well as his ACT team.  Dr. Lucianne Muss evaluated and team reports he is psychiatrically cleared and will be discharged to his ACT team.        Alvira Monday, MD 10/23/20 [phone]

## 2020-10-22 NOTE — ED Notes (Signed)
Pt DC d off unit to home per provider. Pt alert, cooperative, no s/s of distress. DC information given to pt. Belongings given to pt. Pt ambulatory off unit, escorted by NT. Pt transported by ACT team

## 2020-10-22 NOTE — BH Assessment (Signed)
Clinician requested nursing to place TTS machine in patient's room.

## 2020-10-22 NOTE — Consult Note (Signed)
This patient seen by this provider face-to-face at [facility] emergency department.  Continues to be uncooperative with psychiatric evaluation.  Reports "doing all right "refused to answer orientation questions, will not look at provider, faced towards wall throughout interview.  He was calm lying in the bed, hostile attitude and extremely irritable with my questions.  When asked about auditory and visual hallucinations "I am hearing you right now and seeing you right now" asked about suicidal ideations "yes' when asked if he has access to a weapon answers "yes'.  When asked about where he lives "I stay where I am right now" asked about medications answered, "yes I take medicine" when asked about pending legal issues answered, "it ain't your business ""get out of my room "  Per [facility]:  patient has been evading court (6/27 & 6/28) and has been evading them as well. They will send transport to hospital as this patient is psychiatrically cleared. Case reviewed with Dr. Lucianne Muss who is in agreeable that he should be psychiatrically cleared and  discharged to ACT Team.

## 2020-10-22 NOTE — BH Assessment (Signed)
[facility] Assessment Progress Note   Per Dorena Bodo, NP, this voluntary pt does not require psychiatric hospitalization at this time.  Pt is psychiatrically cleared.  Discharge instructions advise pt to continue treatment with the [facility].   At 11:07, and again at 11:28, this writer spoke to [employer] with the [facility].  He reports that pt had court dates on Monday 10/20/2020 and on Tuesday, 10/21/2020.  Mr Urbano Heir believes that pt has been evading ACT Team during this interval in order to avoid court dates.  He reports that pt's ACT Team worker is occupied with another client's needs at this time, but that she will present at [facility] to pick pt up as soon as possible, and will call first.  EDP Alvira Monday, MD and pt's nurse, Waynetta Sandy, have been notified.  Doylene Canning, MA Triage Specialist [phone]

## 2021-01-23 ENCOUNTER — Ambulatory Visit (HOSPITAL_COMMUNITY)
Admission: EM | Admit: 2021-01-23 | Discharge: 2021-01-23 | Disposition: A | Discharge status: Home / Self Care | Payer: Medicaid Other | Attending: Family | Admitting: Family

## 2021-01-23 ENCOUNTER — Other Ambulatory Visit: Payer: Self-pay

## 2021-01-23 DIAGNOSIS — R45851 Suicidal ideations: Secondary | ICD-10-CM

## 2021-01-23 MED ORDER — MIRTAZAPINE 15 MG PO TABS: 15.0000 mg | ORAL_TABLET | Freq: Every day | ORAL | 0 refills | Status: AC

## 2021-01-23 MED ORDER — CHLORPROMAZINE HCL 25 MG PO TABS: 25.0000 mg | ORAL_TABLET | Freq: Two times a day (BID) | ORAL | 0 refills | Status: DC

## 2021-01-23 MED ORDER — BENZTROPINE MESYLATE 1 MG PO TABS: 1.0000 mg | ORAL_TABLET | Freq: Two times a day (BID) | ORAL | 0 refills | Status: AC | PRN

## 2021-01-23 MED ORDER — HALOPERIDOL 10 MG PO TABS: 15.0000 mg | ORAL_TABLET | Freq: Two times a day (BID) | ORAL | 0 refills | Status: AC

## 2021-01-23 NOTE — Progress Notes (Signed)
   01/23/21 0805  [facility] Triage Screening (Walk-ins at [facility] only)  How Did You Hear About [place]? Self  What Is the Reason for Your Visit/Call Today? 45 year old male presents to the [facility] accompanied by police with complaints of suicidal ideations, auditory hallucination seeing smoke and hearing baby crying. He denied homicidal ideations. He denied substance abuse. Chart review shows a history of alcohol and cocaine usage. Report he has been off his medication Hydol for over a month. Report he's needs a refill.  How Long Has This Been Causing You Problems? 1 wk - 1 month  Have You Recently Had Any Thoughts About Hurting Yourself? Yes  How long ago did you have thoughts about hurting yourself? Report suicidal ideations past 30 days  Are You Planning to Commit Suicide/Harm Yourself At This time? No  Have you Recently Had Thoughts About Hurting Someone [name]? No  Are You Planning To Harm Someone At This Time? No  Are you currently experiencing any auditory, visual or other hallucinations? Yes  Please explain the hallucinations you are currently experiencing: report seeing smoke and hearing babies cry  Have You Used Any Alcohol or Drugs in the Past 24 Hours? No (denied)  Do you have any current medical co-morbidities that require immediate attention? No  Clinician description of patient physical appearance/behavior: patient appeared to be falling during Triage.  What Do You Feel Would Help You the Most Today? Medication(s)  If access to [facility] was not available, would you have sought care in the Emergency Department? Yes  Determination of Need Routine (7 days)  Options For Referral Medication Management

## 2021-01-23 NOTE — Discharge Instructions (Signed)

## 2021-01-23 NOTE — ED Provider Notes (Signed)
Behavioral Health Urgent Care Medical Screening Exam  Patient Name: Garrett Arellano MRN: [id number] Date of Evaluation: 01/23/21 Chief Complaint:   Diagnosis:  Final diagnoses:  Suicidal ideation    History of Present illness: Garrett Arellano is a 45 y.o. male.  Patient presents voluntarily for walk-in assessment.  He reports "I am suicidal, I have been suicidal for as long as I can remember."  Patient denies plan or intent to harm self.  He denies self-harm behavior.  Patient is assessed face-to-face by nurse practitioner.  He is seated in assessment area, no acute distress.  He is alert and oriented, minimally cooperative during assessment.  He presents with irritable mood, labile affect. No homicidal ideations. He has normal speech and behavior.  He denies both auditory and visual hallucinations.  Patient is able to converse coherently with goal-directed thoughts and no distractibility or preoccupation.  He denies paranoia.  Objectively there is no evidence of psychosis/mania or delusional thinking.  Patient has been diagnosed with schizophrenia, schizoaffective disorder and polysubstance dependence.  He is currently followed by [facility].  He becomes frustrated when discussing [facility] follow-up.  He indicates that act team do follow-up with him and "get me the things that I need."  He did report to TTS counselor that he needed refill on medications, refill provided.  [place] reports he resides alone in [place].  He denies access to weapons.  He is not currently employed.  He denies alcohol and substance use during the last 2 weeks.  Patient offered support and encouragement.  Attempted to call for collateral information however patient refuses. When discussing treatment plan including discharge, patient states "stop talking or you will make me do something to you."  Patient again refused to leave facility, states "you will have to call the police, will not leave."  [facility]  police arrived, patient placed in handcuffs and walked from facility back while enforcement.   Psychiatric Specialty Exam  Presentation  General Appearance:Disheveled  Eye Contact:Good  Speech:Clear and Coherent; Normal Rate  Speech Volume:Normal  Handedness:Right   Mood and Affect  Mood:Irritable  Affect:Congruent   Thought Process  Thought Processes:Coherent; Goal Directed; Linear  Descriptions of Associations:Intact  Orientation:Full (Time, Place and Person)  Thought Content:Logical  Diagnosis of Schizophrenia or Schizoaffective disorder in past: Yes  Duration of Psychotic Symptoms: Less than six months  Hallucinations:None  Ideas of Reference:None  Suicidal Thoughts:Yes, Passive Without Intent; Without Plan  Homicidal Thoughts:No   Sensorium  Memory:Immediate Good; Recent Good; Remote Good  Judgment:Fair  Insight:Fair   Executive Functions  Concentration:Good  Attention Span:Good  Recall:Good  Fund of Knowledge:Good  Language:Good   Psychomotor Activity  Psychomotor Activity:Normal   Assets  Assets:Communication Skills; Desire for Improvement; Housing; Intimacy; Leisure Time; Physical Health; Resilience; Social Support   Sleep  Sleep:Good  Number of hours:  No data recorded  No data recorded  Physical Exam: Physical Exam Vitals and nursing note reviewed.  Constitutional:      Appearance: Normal appearance. He is well-developed.  HENT:     Head: Normocephalic and atraumatic.     Nose: Nose normal.  Cardiovascular:     Rate and Rhythm: Normal rate.  Pulmonary:     Effort: Pulmonary effort is normal.  Musculoskeletal:        General: Normal range of motion.     Cervical back: Normal range of motion.  Skin:    General: Skin is warm and dry.  Neurological:     Mental Status:  He is alert and oriented to person, place, and time.  Psychiatric:        Attention and Perception: Attention and perception normal.        Mood  and Affect: Mood is depressed. Affect is labile.        Speech: Speech normal.        Behavior: Behavior is combative.        Thought Content: Thought content normal.        Cognition and Memory: Cognition and memory normal.   Review of Systems  Constitutional: Negative.   HENT: Negative.    Eyes: Negative.   Respiratory: Negative.    Cardiovascular: Negative.   Gastrointestinal: Negative.   Genitourinary: Negative.   Musculoskeletal: Negative.   Skin: Negative.   Neurological: Negative.   Endo/Heme/Allergies: Negative.   Psychiatric/Behavioral:  Positive for depression.   Blood pressure (!) 173/96, pulse 86, temperature 97.6 F (36.4 C), temperature source Oral, resp. rate 16, SpO2 100 %. There is no height or weight on file to calculate BMI.  Musculoskeletal: Strength & Muscle Tone: within normal limits Gait & Station: normal Patient leans: N/A   [facility] MSE Discharge Disposition for Follow up and Recommendations: Based on my evaluation the patient does not appear to have an emergency medical condition and can be discharged with resources and follow up care in outpatient services for Medication Management and Individual Therapy Patient reviewed with Dr. Nelly Rout. Follow-up with established outpatient psychiatry, [facility]. Continue current medications including: -Benztropine 1 mg twice daily -Chlorpromazine 25 mg twice daily -Haloperidol 15 mg twice daily -Mirtazapine 15 mg nightly   Lenard Lance, FNP 01/23/2021, 8:26 AM

## 2021-01-23 NOTE — ED Notes (Signed)
Pt discharged in police custody in handcuffs due to aggressive and agitated behaviors. Pt demanding to go to jail and started hitting the walls. No injuries or damage to property observed. Unable to be redirected. Pt escorted to police car in handcuffs. Pt belongings given to officer along with AVS and RX's. Safety maintained.

## 2021-02-02 ENCOUNTER — Telehealth (HOSPITAL_COMMUNITY): Payer: Self-pay | Admitting: Emergency Medicine

## 2021-02-02 NOTE — BH Assessment (Signed)
Care Management - Follow Up [facility] Discharges   Writer attempted to make contact with patient today and was unsuccessful.  Writer left a HIPPA compliant voice message.   

## 2023-03-28 ENCOUNTER — Encounter (HOSPITAL_COMMUNITY): Payer: Self-pay | Admitting: Emergency Medicine

## 2023-03-28 ENCOUNTER — Emergency Department (HOSPITAL_COMMUNITY)
Admission: EM | Admit: 2023-03-28 | Discharge: 2023-03-28 | Disposition: A | Discharge status: Home / Self Care | Payer: MEDICAID | Attending: Emergency Medicine | Admitting: Emergency Medicine

## 2023-03-28 ENCOUNTER — Emergency Department (HOSPITAL_COMMUNITY): Payer: MEDICAID

## 2023-03-28 ENCOUNTER — Other Ambulatory Visit: Payer: Self-pay

## 2023-03-28 DIAGNOSIS — R079 Chest pain, unspecified: Secondary | ICD-10-CM | POA: Insufficient documentation

## 2023-03-28 DIAGNOSIS — R45851 Suicidal ideations: Secondary | ICD-10-CM | POA: Insufficient documentation

## 2023-03-28 LAB — COMPREHENSIVE METABOLIC PANEL
ALT: 28 U/L (ref 0–44)
AST: 23 U/L (ref 15–41)
Albumin: 4 g/dL (ref 3.5–5.0)
Alkaline Phosphatase: 26 U/L — ABNORMAL LOW (ref 38–126)
Anion gap: 12 (ref 5–15)
BUN: 13 mg/dL (ref 6–20)
CO2: 21 mmol/L — ABNORMAL LOW (ref 22–32)
Calcium: 9.8 mg/dL (ref 8.9–10.3)
Chloride: 103 mmol/L (ref 98–111)
Creatinine, Ser: 0.96 mg/dL (ref 0.61–1.24)
GFR, Estimated: >60 mL/min (ref >60)
Glucose, Bld: 101 mg/dL — ABNORMAL HIGH (ref 70–99)
Potassium: 3.8 mmol/L (ref 3.5–5.1)
Sodium: 136 mmol/L (ref 135–145)
Total Bilirubin: 0.7 mg/dL (ref <1.2)
Total Protein: 7.8 g/dL (ref 6.5–8.1)

## 2023-03-28 LAB — CBC WITH DIFFERENTIAL/PLATELET
Abs Immature Granulocytes: 0.03 10*3/uL (ref 0.00–0.07)
Basophils Absolute: 0 10*3/uL (ref 0.0–0.1)
Basophils Relative: 0 %
Eosinophils Absolute: 0.1 10*3/uL (ref 0.0–0.5)
Eosinophils Relative: 1 %
HCT: 43.3 % (ref 39.0–52.0)
Hemoglobin: 13.7 g/dL (ref 13.0–17.0)
Immature Granulocytes: 0 %
Lymphocytes Relative: 35 %
Lymphs Abs: 2.4 10*3/uL (ref 0.7–4.0)
MCH: 28 pg (ref 26.0–34.0)
MCHC: 31.6 g/dL (ref 30.0–36.0)
MCV: 88.4 fL (ref 80.0–100.0)
Monocytes Absolute: 0.9 10*3/uL (ref 0.1–1.0)
Monocytes Relative: 13 %
Neutro Abs: 3.5 10*3/uL (ref 1.7–7.7)
Neutrophils Relative %: 51 %
Platelets: 323 10*3/uL (ref 150–400)
RBC: 4.9 MIL/uL (ref 4.22–5.81)
RDW: 15.2 % (ref 11.5–15.5)
WBC: 6.8 10*3/uL (ref 4.0–10.5)
nRBC: 0 % (ref 0.0–0.2)

## 2023-03-28 LAB — TROPONIN I (HIGH SENSITIVITY)
Troponin I (High Sensitivity): 6 ng/L (ref <18)
Troponin I (High Sensitivity): 6 ng/L (ref <18)

## 2023-03-28 LAB — ETHANOL: Alcohol, Ethyl (B): <10 mg/dL (ref <10)

## 2023-03-28 NOTE — ED Notes (Signed)
Pt dressed into blue paper scrubs. This RN attempted to obtain blood work, pt refused. MD Bero informed patient that blood work is necessary for his ED workup and if he would like to refused he will be allowed to leave. Pt then stated he will allow blood work.

## 2023-03-28 NOTE — ED Provider Notes (Cosign Needed Addendum)
    Accepted handoff at shift change from [employer]. Please see prior provider note for more detail.   Briefly: Patient is 47 y.o. "He apparently walked up to an ambulance and reported that he was suicidal and having hallucinations and needed to come to the hospital. He states he is hearing voices that are telling him to "kill himself". He has not attempted any self-harm. He denies any drug use today. He reports chest pain began about 1 hour prior to arrival. Very vague in his description. No associated shortness of breath, cough, or upper respiratory symptoms. Patient is shivering on arrival, states he has been outside all night."  DDX: concern for acute coronary syndrome, congestive heart failure, pericarditis, pneumonia, pulmonary embolism, tension pneumothorax, esophageal rupture, aortic dissection, cardiac tamponade, musculoskeletal  Plan:  -Dispo pending 2nd trop -Initial and repeat troponins within normal limits.  CBC without leukocytosis or anemia.  CMP reassuring.  EtOH negative. Patient tolerating PO intake. -Patient requesting to leave. States that he needs to go to work. I was able to look at past psych notes and it appears that patient has been here in the past for SI and did not meet criteria for inpatient admission. Patient stating that he has "always been suicidal" but currently denies SI, HI, hallucinations. Patient saying that he was "just talking out of his head earlier today." Patient without SI plan. Recommended following up with outpatient provider. Patient verbalized understanding of plan. -Patient AAOx4.  On my initial exam, the pt was linear in thought, appropriate in affect, and overall well-appearing.  Patient appears able to make his own medical decisions. -Patient currently denying any symptoms.  Patient afebrile with stable vitals.  Provided with return precautions.  Discharged in good condition.      Dorthy Cooler, [place] 03/28/23 2130    Sabas Sous,  MD 03/29/23 [phone]

## 2023-03-28 NOTE — ED Notes (Signed)
This RN asked patient to change into hospital scrubs per policy. Pt refused. Pt states "you can't make me change my clothes, its against the law." Allyne Gee PA made aware and at bedside. Allyne Gee PA instructed pt he must abide by policy and change clothes to be seen. Pt continued to refuse. Pt states "I am not suicidal, I was playing." Pt informed his statements prior are taken seriously by staff and policy states he must wear hospital scrubs. Pt requests to use bathroom and will change clothes in bathroom. Pt escorted to restroom by ED tech to change.

## 2023-03-28 NOTE — ED Notes (Signed)
Patients belongings returned, bus pass provided.
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
## 2023-03-28 NOTE — ED Notes (Signed)
Declined discharge vitals

## 2023-03-28 NOTE — ED Notes (Signed)
Pt sleeping on stretcher. Symmetrical chest rise & fall noted. No needs identified at this time.

## 2023-03-28 NOTE — ED Notes (Signed)
Patient dressed in blue paper scrubs. Pts belongings secured in locker #10. Security wanded patient for safety and disposed of glass piece of drug paraphernalia. Pt aware of need for urine sample, states he is unable to provide sample at this time. Warm blankets provided. Staffing made aware of need for safety sitter, per staffing need added to list, no available sitter at this time.

## 2023-03-28 NOTE — ED Triage Notes (Signed)
Pt arrived via EMS from fire station. Pt approach ambulance and endorsed suicidal ideation and auditory hallucinations. Pt stated to EMS that the "voices are telling him to kill himself." Pt also reported chest pain x 1 hour.

## 2023-03-28 NOTE — Discharge Instructions (Signed)
 It was a pleasure caring for you today.  Please follow-up with your primary care provider.  Seek emergency care experiencing any new or worsening symptoms.

## 2023-05-31 ENCOUNTER — Emergency Department (HOSPITAL_COMMUNITY): Payer: MEDICAID

## 2023-05-31 ENCOUNTER — Encounter (HOSPITAL_COMMUNITY): Payer: Self-pay

## 2023-05-31 ENCOUNTER — Emergency Department (HOSPITAL_COMMUNITY)
Admission: EM | Admit: 2023-05-31 | Discharge: 2023-06-01 | Disposition: A | Discharge status: Other Acute Inpatient Hospital | Payer: MEDICAID | Attending: Emergency Medicine | Admitting: Emergency Medicine

## 2023-05-31 ENCOUNTER — Other Ambulatory Visit: Payer: Self-pay

## 2023-05-31 DIAGNOSIS — F250 Schizoaffective disorder, bipolar type: Secondary | ICD-10-CM | POA: Diagnosis present

## 2023-05-31 DIAGNOSIS — R45851 Suicidal ideations: Secondary | ICD-10-CM | POA: Diagnosis not present

## 2023-05-31 DIAGNOSIS — D72819 Decreased white blood cell count, unspecified: Secondary | ICD-10-CM | POA: Insufficient documentation

## 2023-05-31 DIAGNOSIS — E876 Hypokalemia: Secondary | ICD-10-CM | POA: Diagnosis not present

## 2023-05-31 DIAGNOSIS — R600 Localized edema: Secondary | ICD-10-CM | POA: Diagnosis present

## 2023-05-31 DIAGNOSIS — R739 Hyperglycemia, unspecified: Secondary | ICD-10-CM | POA: Insufficient documentation

## 2023-05-31 LAB — COMPREHENSIVE METABOLIC PANEL
ALT: 25 U/L (ref 0–44)
AST: 22 U/L (ref 15–41)
Albumin: 3.8 g/dL (ref 3.5–5.0)
Alkaline Phosphatase: 22 U/L — ABNORMAL LOW (ref 38–126)
Anion gap: 10 (ref 5–15)
BUN: 9 mg/dL (ref 6–20)
CO2: 24 mmol/L (ref 22–32)
Calcium: 9.7 mg/dL (ref 8.9–10.3)
Chloride: 106 mmol/L (ref 98–111)
Creatinine, Ser: 0.81 mg/dL (ref 0.61–1.24)
GFR, Estimated: >60 mL/min (ref >60)
Glucose, Bld: 107 mg/dL — ABNORMAL HIGH (ref 70–99)
Potassium: 3.1 mmol/L — ABNORMAL LOW (ref 3.5–5.1)
Sodium: 140 mmol/L (ref 135–145)
Total Bilirubin: 0.6 mg/dL (ref 0.0–1.2)
Total Protein: 7 g/dL (ref 6.5–8.1)

## 2023-05-31 LAB — ACETAMINOPHEN LEVEL: Acetaminophen (Tylenol), Serum: <10 ug/mL — ABNORMAL LOW (ref 10–30)

## 2023-05-31 LAB — CBC WITH DIFFERENTIAL/PLATELET
Abs Immature Granulocytes: 0.01 10*3/uL (ref 0.00–0.07)
Basophils Absolute: 0 10*3/uL (ref 0.0–0.1)
Basophils Relative: 1 %
Eosinophils Absolute: 0.1 10*3/uL (ref 0.0–0.5)
Eosinophils Relative: 2 %
HCT: 42.2 % (ref 39.0–52.0)
Hemoglobin: 13.5 g/dL (ref 13.0–17.0)
Immature Granulocytes: 0 %
Lymphocytes Relative: 53 %
Lymphs Abs: 1.8 10*3/uL (ref 0.7–4.0)
MCH: 27.4 pg (ref 26.0–34.0)
MCHC: 32 g/dL (ref 30.0–36.0)
MCV: 85.6 fL (ref 80.0–100.0)
Monocytes Absolute: 0.3 10*3/uL (ref 0.1–1.0)
Monocytes Relative: 9 %
Neutro Abs: 1.2 10*3/uL — ABNORMAL LOW (ref 1.7–7.7)
Neutrophils Relative %: 35 %
Platelets: 257 10*3/uL (ref 150–400)
RBC: 4.93 MIL/uL (ref 4.22–5.81)
RDW: 13.6 % (ref 11.5–15.5)
WBC: 3.3 10*3/uL — ABNORMAL LOW (ref 4.0–10.5)
nRBC: 0 % (ref 0.0–0.2)

## 2023-05-31 LAB — ETHANOL: Alcohol, Ethyl (B): 66 mg/dL — ABNORMAL HIGH (ref <10)

## 2023-05-31 LAB — SALICYLATE LEVEL: Salicylate Lvl: <7 mg/dL — ABNORMAL LOW (ref 7.0–30.0)

## 2023-05-31 LAB — POTASSIUM: Potassium: 4 mmol/L (ref 3.5–5.1)

## 2023-05-31 MED ORDER — CHLORPROMAZINE HCL 25 MG PO TABS
25.0000 mg | ORAL_TABLET | Freq: Two times a day (BID) | ORAL | Status: DC
  Administered 2023-05-31: 25 mg via ORAL
  Filled 2023-05-31: qty 1

## 2023-05-31 MED ORDER — ALUM & MAG HYDROXIDE-SIMETH 200-200-20 MG/5ML PO SUSP: 30.0000 mL | Freq: Four times a day (QID) | ORAL | Status: DC | PRN

## 2023-05-31 MED ORDER — ACETAMINOPHEN 325 MG PO TABS: 650.0000 mg | ORAL_TABLET | ORAL | Status: DC | PRN

## 2023-05-31 MED ORDER — MIRTAZAPINE 7.5 MG PO TABS
15.0000 mg | ORAL_TABLET | Freq: Every day | ORAL | Status: DC
  Administered 2023-05-31: 15 mg via ORAL
  Filled 2023-05-31: qty 2

## 2023-05-31 MED ORDER — HALOPERIDOL 5 MG PO TABS
10.0000 mg | ORAL_TABLET | Freq: Two times a day (BID) | ORAL | Status: DC
Start: 2023-05-31 — End: 2023-06-01
  Administered 2023-05-31 – 2023-06-01 (×2): 10 mg via ORAL
  Filled 2023-05-31 (×2): qty 2

## 2023-05-31 MED ORDER — ONDANSETRON HCL 4 MG PO TABS: 4.0000 mg | ORAL_TABLET | Freq: Three times a day (TID) | ORAL | Status: DC | PRN

## 2023-05-31 MED ORDER — HALOPERIDOL 5 MG PO TABS
15.0000 mg | ORAL_TABLET | Freq: Two times a day (BID) | ORAL | Status: DC
  Administered 2023-05-31: 15 mg via ORAL
  Filled 2023-05-31: qty 3

## 2023-05-31 MED ORDER — NICOTINE 7 MG/24HR TD PT24: 7.0000 mg | MEDICATED_PATCH | Freq: Every day | TRANSDERMAL | Status: DC

## 2023-05-31 MED ORDER — POTASSIUM CHLORIDE CRYS ER 20 MEQ PO TBCR
40.0000 meq | EXTENDED_RELEASE_TABLET | ORAL | Status: AC
  Administered 2023-05-31 (×3): 40 meq via ORAL
  Filled 2023-05-31 (×2): qty 2

## 2023-05-31 MED ORDER — BENZTROPINE MESYLATE 0.5 MG PO TABS
1.0000 mg | ORAL_TABLET | Freq: Two times a day (BID) | ORAL | Status: DC
  Administered 2023-05-31 – 2023-06-01 (×3): 1 mg via ORAL
  Filled 2023-05-31 (×3): qty 2

## 2023-05-31 MED ORDER — FUROSEMIDE 40 MG PO TABS
40.0000 mg | ORAL_TABLET | Freq: Once | ORAL | Status: AC
  Administered 2023-05-31: 40 mg via ORAL
  Filled 2023-05-31: qty 1

## 2023-05-31 NOTE — ED Notes (Signed)
Pt changed into burgundy scrubs. Pt belongings placed in 2 personal belonging's bag. Bags LABELED and placed in cabinet hall d. Security wanded pt
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
## 2023-05-31 NOTE — ED Provider Notes (Signed)
NP attempted to assess face to face- patient is difficult to arouse, RN reported patient had morning Thorazine, Haldol, and Cogentin at 0730am- will follow-up when patient is able to participate in assessment. - Recruitment consultant at bedside
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
## 2023-06-01 NOTE — Progress Notes (Signed)
 LCSW Progress Note  [id number]   Garrett Arellano  06/01/2023  1:12 PM  Description:   Inpatient Psychiatric Referral  Patient was recommended inpatient per Garrett Arellano Patient NP. There are no available beds at [facility], per [facility] Cherylynn Ernst RN. Patient was referred to the following out of network facilities:Situation ongoing, CSW to continue following and up Destination  Service Provider Address Phone Fax  [facility] [place] [address] [place], [place] [address] [phone]  [facility] [address] [place] [address] [phone]  [facility] [address] [place] [address] [phone]  [facility] [address], [place] [address] [phone]  [facility] [address] [place], [place] [address] [phone]  [facility] [address], [place] [address] [phone]  [facility] [address] [place] [address] [phone]  [facility] [address] [place], [place] [address] [phone]  [facility] [address] [place] [address] [phone]  [facility] [address] [place] [address] [phone]  [facility] [address], [place] [address] [phone]  [facility] [address] [place] [address] [phone]  [facility] [address], [place] [address] [phone]  [facility] [address], [place] [address] [phone]  [facility] [place] [phone]  [facility] [address] [place] [address] [phone]  [facility] EFAX [address] [place] [phone]  [facility] [address], [place] [address] [phone]  [facility] [address] [place] [address] [phone]  [facility] [address], [place] [address] [phone]  [facility] [address] [place] [address] [phone]  [facility] [address], [place] [address] [phone]  [facility] [address], [place] [address] [phone]  [facility] Psychiatry Inpatient [facility] [place] [phone]  [facility] [address] [place] [address] [phone]  [facility] [address], [place] [address] [phone]  [facility] [address] [place] [address] [phone]  [facility] Health Patient Placement [facility], [place] [phone]  [facility] [address] [place] [address] [phone]       [place] Surie Suchocki, MSW, LCSW  06/01/2023 1:12 PM

## 2023-06-01 NOTE — ED Notes (Signed)
 Pt was given a sandwich and orange juice.

## 2023-06-01 NOTE — ED Provider Notes (Signed)
 Patient has been accepted by [facility] for inpatient care.  Accepting provider is Kondal Madaram.   Jadence Kinlaw, MD 06/01/23 1435

## 2023-06-01 NOTE — ED Notes (Signed)
 Pt has been accepted to [facility] TODAY 06/01/2023 Bed assignment: Main campus 2 EAST   Pt meets inpatient criteria per [name] Patient NP  Attending Physician will be Millie Manners, MD  Report can be called to: [phone] (this is a pager, please leave call-back number when giving report)  Pt can arrive after ASAP   Care Team Notified: Norleen Haddock RN, [name] Patient NP

## 2023-06-01 NOTE — Progress Notes (Addendum)
 Pt has been accepted to [facility] TODAY 06/01/2023 Bed assignment: Main campus 2 EAST   Pt meets inpatient criteria per [name] Patient NP  Attending Physician will be Millie Manners, MD  Report can be called to: [phone] (this is a pager, please leave call-back number when giving report)  Pt can arrive after ASAP   Care Team Notified:  Norleen Haddock RN, [name] Patient NP   [place] Anja Neuzil LCSW-A   06/01/2023 2:06 PM

## 2023-06-01 NOTE — Progress Notes (Signed)
 LCSW Progress Note  [id number]   Garrett Arellano  06/01/2023  12:20 AM    Inpatient Behavioral Health Placement  Pt meets inpatient criteria per Cathaleen Adam, PMHNP. There are no available beds within [facility] per [facility] [name],RN. Referral was sent to the following facilities;   Destination  Service Provider Address Phone [facility] [address] [place], [place] [address] [phone]  [facility] [address] [place] [address] [phone]  [facility] [address] [place] [address] [phone]  [facility] [address], [place] [address] [phone]  [facility] [address] [place], [place] [address] [phone]  [facility] [address], [place] [address] [phone]  [facility]Ad[facility] [address] [place] [address] [phone]  [facility] [address] [place], [place] [address] [phone]  [facility] [address] [place] [address] [phone]  [facility] [address] [place] [address] [phone]  [facility] [address] [place] [address] [phone]  [facility] [address], [place] [address] [phone]  [facility] [address], [place] [address] [phone]  [facility] [address], [place] [address] [phone]  [facility] BED Management Behavioral Health [place] [phone]  [facility] [address] [place] [address] [phone]  [facility] EFAX [address]  [place] [phone]  [facility] [address] [place] [address] [phone]  [facility] [address], [place] [address] [phone]  [facility] [address] [place] [address] [phone]  [facility] [address] [place] [address] [phone]  [facility] [address] [place], [place] [address] [phone]  [facility] [address] [place] [address] [phone]  [facility] Psychiatry Inpatient [facility] [place] [phone]  [facility] [address] [place] [address] [phone]  [facility] [address], [place] [address] [phone]  [facility] [address] [place] [address] [phone]  [facility] Patient Placement [facility], [place] [phone]  [facility] [address] [place] [address] [phone]    Situation ongoing,  CSW will follow up.    Mitzie GEANNIE Pinal, MSW, LCSWA 06/01/2023 12:20 AM

## 2023-06-01 NOTE — ED Notes (Signed)
 Pt made 1 of 3 phone calls for the day

## 2023-06-01 NOTE — ED Notes (Signed)
 PT off the floor to shower in TCU, accompanied by sitter

## 2023-06-11 ENCOUNTER — Encounter (HOSPITAL_COMMUNITY): Payer: Self-pay

## 2023-06-11 ENCOUNTER — Emergency Department (HOSPITAL_COMMUNITY)
Admission: EM | Admit: 2023-06-11 | Discharge: 2023-06-14 | Disposition: A | Discharge status: Other Acute Inpatient Hospital | Payer: MEDICAID | Attending: Emergency Medicine | Admitting: Emergency Medicine

## 2023-06-11 ENCOUNTER — Other Ambulatory Visit: Payer: Self-pay

## 2023-06-11 DIAGNOSIS — R45851 Suicidal ideations: Secondary | ICD-10-CM | POA: Diagnosis not present

## 2023-06-11 DIAGNOSIS — F250 Schizoaffective disorder, bipolar type: Secondary | ICD-10-CM | POA: Diagnosis present

## 2023-06-11 DIAGNOSIS — F209 Schizophrenia, unspecified: Secondary | ICD-10-CM | POA: Diagnosis present

## 2023-06-11 LAB — CBG MONITORING, ED: Glucose-Capillary: 128 mg/dL — ABNORMAL HIGH (ref 70–99)

## 2023-06-11 NOTE — ED Triage Notes (Signed)
 Pt BIB EMS from a gas station on [address] with si and hi. Pt states that his mom died and this is why he feels this way. Pt reports being out of his Metformin x 1 week and his feet are swollen.

## 2023-06-12 DIAGNOSIS — F209 Schizophrenia, unspecified: Secondary | ICD-10-CM | POA: Diagnosis not present

## 2023-06-12 LAB — CBC WITH DIFFERENTIAL/PLATELET
Abs Immature Granulocytes: 0.01 10*3/uL (ref 0.00–0.07)
Basophils Absolute: 0 10*3/uL (ref 0.0–0.1)
Basophils Relative: 0 %
Eosinophils Absolute: 0.1 10*3/uL (ref 0.0–0.5)
Eosinophils Relative: 2 %
HCT: 39.9 % (ref 39.0–52.0)
Hemoglobin: 12.9 g/dL — ABNORMAL LOW (ref 13.0–17.0)
Immature Granulocytes: 0 %
Lymphocytes Relative: 47 %
Lymphs Abs: 2.4 10*3/uL (ref 0.7–4.0)
MCH: 27.4 pg (ref 26.0–34.0)
MCHC: 32.3 g/dL (ref 30.0–36.0)
MCV: 84.7 fL (ref 80.0–100.0)
Monocytes Absolute: 0.4 10*3/uL (ref 0.1–1.0)
Monocytes Relative: 8 %
Neutro Abs: 2.3 10*3/uL (ref 1.7–7.7)
Neutrophils Relative %: 43 %
Platelets: 301 10*3/uL (ref 150–400)
RBC: 4.71 MIL/uL (ref 4.22–5.81)
RDW: 13.2 % (ref 11.5–15.5)
WBC: 5.3 10*3/uL (ref 4.0–10.5)
nRBC: 0 % (ref 0.0–0.2)

## 2023-06-12 LAB — COMPREHENSIVE METABOLIC PANEL
ALT: 25 U/L (ref 0–44)
AST: 15 U/L (ref 15–41)
Albumin: 3.5 g/dL (ref 3.5–5.0)
Alkaline Phosphatase: 20 U/L — ABNORMAL LOW (ref 38–126)
Anion gap: 9 (ref 5–15)
BUN: 15 mg/dL (ref 6–20)
CO2: 22 mmol/L (ref 22–32)
Calcium: 9.2 mg/dL (ref 8.9–10.3)
Chloride: 107 mmol/L (ref 98–111)
Creatinine, Ser: 0.72 mg/dL (ref 0.61–1.24)
GFR, Estimated: >60 mL/min (ref >60)
Glucose, Bld: 105 mg/dL — ABNORMAL HIGH (ref 70–99)
Potassium: 3.7 mmol/L (ref 3.5–5.1)
Sodium: 138 mmol/L (ref 135–145)
Total Bilirubin: 0.7 mg/dL (ref 0.0–1.2)
Total Protein: 6.3 g/dL — ABNORMAL LOW (ref 6.5–8.1)

## 2023-06-12 LAB — SALICYLATE LEVEL: Salicylate Lvl: <7 mg/dL — ABNORMAL LOW (ref 7.0–30.0)

## 2023-06-12 LAB — ETHANOL: Alcohol, Ethyl (B): <10 mg/dL (ref <10)

## 2023-06-12 LAB — ACETAMINOPHEN LEVEL: Acetaminophen (Tylenol), Serum: <10 ug/mL — ABNORMAL LOW (ref 10–30)

## 2023-06-12 MED ORDER — LORAZEPAM 1 MG PO TABS: 0.0000 mg | ORAL_TABLET | Freq: Two times a day (BID) | ORAL | Status: DC

## 2023-06-12 MED ORDER — BENZTROPINE MESYLATE 1 MG PO TABS
1.0000 mg | ORAL_TABLET | Freq: Two times a day (BID) | ORAL | Status: DC
  Administered 2023-06-12 – 2023-06-14 (×6): 1 mg via ORAL
  Filled 2023-06-12: qty 1
  Filled 2023-06-12 (×3): qty 2
  Filled 2023-06-12: qty 1
  Filled 2023-06-12: qty 2

## 2023-06-12 MED ORDER — ENALAPRIL MALEATE 2.5 MG PO TABS
5.0000 mg | ORAL_TABLET | Freq: Every day | ORAL | Status: DC
  Administered 2023-06-12 – 2023-06-13 (×3): 5 mg via ORAL
  Filled 2023-06-12 (×3): qty 2

## 2023-06-12 MED ORDER — LORAZEPAM 1 MG PO TABS
0.0000 mg | ORAL_TABLET | Freq: Four times a day (QID) | ORAL | Status: DC
  Filled 2023-06-12: qty 1

## 2023-06-12 MED ORDER — AMLODIPINE BESYLATE 5 MG PO TABS
10.0000 mg | ORAL_TABLET | Freq: Every day | ORAL | Status: DC
  Administered 2023-06-12 – 2023-06-13 (×3): 10 mg via ORAL
  Filled 2023-06-12 (×3): qty 2

## 2023-06-12 MED ORDER — THIAMINE MONONITRATE 100 MG PO TABS
100.0000 mg | ORAL_TABLET | Freq: Every day | ORAL | Status: DC
  Administered 2023-06-12 – 2023-06-13 (×2): 100 mg via ORAL
  Filled 2023-06-12 (×3): qty 1

## 2023-06-12 MED ORDER — LORAZEPAM 2 MG/ML IJ SOLN: 0.0000 mg | Freq: Four times a day (QID) | INTRAMUSCULAR | Status: DC

## 2023-06-12 MED ORDER — HALOPERIDOL 5 MG PO TABS
15.0000 mg | ORAL_TABLET | Freq: Two times a day (BID) | ORAL | Status: DC
  Administered 2023-06-12 – 2023-06-14 (×6): 15 mg via ORAL
  Filled 2023-06-12 (×6): qty 3

## 2023-06-12 MED ORDER — LORAZEPAM 2 MG/ML IJ SOLN: 0.0000 mg | Freq: Four times a day (QID) | INTRAMUSCULAR | Status: AC

## 2023-06-12 MED ORDER — LORAZEPAM 2 MG/ML IJ SOLN: 0.0000 mg | Freq: Two times a day (BID) | INTRAMUSCULAR | Status: DC

## 2023-06-12 MED ORDER — LORAZEPAM 1 MG PO TABS
0.0000 mg | ORAL_TABLET | Freq: Four times a day (QID) | ORAL | Status: AC
  Administered 2023-06-12: 2 mg via ORAL
  Filled 2023-06-12: qty 2

## 2023-06-12 MED ORDER — THIAMINE HCL 100 MG/ML IJ SOLN
100.0000 mg | Freq: Every day | INTRAMUSCULAR | Status: DC
  Filled 2023-06-12: qty 2

## 2023-06-12 MED ORDER — THIAMINE MONONITRATE 100 MG PO TABS
100.0000 mg | ORAL_TABLET | Freq: Every day | ORAL | Status: DC
  Administered 2023-06-12 – 2023-06-14 (×2): 100 mg via ORAL
  Filled 2023-06-12 (×3): qty 1

## 2023-06-12 MED ORDER — CHLORPROMAZINE HCL 25 MG PO TABS
25.0000 mg | ORAL_TABLET | Freq: Two times a day (BID) | ORAL | Status: DC
Start: 2023-06-12 — End: 2023-06-14
  Administered 2023-06-12 – 2023-06-14 (×6): 25 mg via ORAL
  Filled 2023-06-12 (×6): qty 1

## 2023-06-12 MED ORDER — MIRTAZAPINE 7.5 MG PO TABS
15.0000 mg | ORAL_TABLET | Freq: Every day | ORAL | Status: DC
Start: 2023-06-12 — End: 2023-06-14
  Administered 2023-06-12 – 2023-06-13 (×3): 15 mg via ORAL
  Filled 2023-06-12 (×2): qty 2
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
## 2023-06-12 NOTE — Progress Notes (Signed)
 Patient has been denied by [facility] due to no appropriate beds available. Patient meets BH inpatient criteria per [facility], PHMNP. Patient has been faxed out to the following facilities:   [facility] [address] [place] [address] [phone]  [facility] [address] [place] [address] [phone]  [facility] [address], [place] [address] [phone]  [facility] Health Patient Placement [facility], [place] [phone]  [facility] [address] [place] [address] [phone]  [facility] [address] [place] [address] [phone]  [facility] [address] [place] [address] [phone]  [facility] EFAX [address] [place] [phone]  [facility] [address] [place] [address] [phone]  [facility] [address] [place] [address] [phone]  [facility] [address] [place], [place] [address] [phone]  [facility] [address] [place] [address] [phone]  [facility] [address] [place] [address] [phone]  [facility] [address] [place] [address] [phone]  [facility] [address], [place] [address] [phone]  [facility] [address] [place] [address] [phone]  [facility] [address] [place] [address] [phone]   Damita Dunnings, MSW, LCSW-A  6:27 PM 06/12/2023
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
## 2023-06-12 NOTE — ED Notes (Signed)
 Tech did make an attempt to wake patient up for psych consult patient refused.

## 2023-06-12 NOTE — ED Provider Notes (Signed)
 [place] EMERGENCY DEPARTMENT AT [facility] Provider Note   CSN: [id number] Arrival date & time: 06/11/23  2221     History {Add pertinent medical, surgical, social history, OB history to HPI:1} Chief Complaint  Patient presents with   Suicidal    Garrett Arellano is a 48 y.o. male.  Patient with a history of bipolar disorder, schizophrenia, alcohol abuse presenting with suicidal thoughts.  States been feeling this way for the past several days given the recent death of his mother.  He states he is having thoughts of walking into traffic.  States he wants to hurt people who "hurt me".  He states he is hearing crying babies.  Denies any recent alcohol or drug use.  Denies any physical complaints but states his feet are hurting him and he has not had his metformin for about a week.  Does not check his blood sugars.  Denies any headache, chest pain, shortness of breath, abdominal pain, nausea or vomiting.  No fever.  No chest pain or shortness of breath.  The history is provided by the patient and the EMS personnel.       Home Medications Prior to Admission medications   Medication Sig Start Date End Date Taking? Authorizing Provider  amLODipine (NORVASC) 10 MG tablet Take 10 mg by mouth at bedtime.    [provider]  benztropine (COGENTIN) 1 MG tablet Take 1 tablet (1 mg total) by mouth 2 (two) times daily as needed. Patient taking differently: Take 1 mg by mouth in the morning and at bedtime. 01/23/21   Lenard Lance, FNP  chlorproMAZINE (THORAZINE) 25 MG tablet Take 1 tablet (25 mg total) by mouth 2 (two) times daily. Patient not taking: Reported on 05/31/2023 01/23/21   Lenard Lance, FNP  enalapril (VASOTEC) 5 MG tablet Take 5 mg by mouth at bedtime.    [provider]  haloperidol (HALDOL) 10 MG tablet Take 1.5 tablets (15 mg total) by mouth 2 (two) times daily. Patient not taking: Reported on 05/31/2023 01/23/21 05/31/23  Lenard Lance, FNP  haloperidol  (HALDOL) 5 MG tablet Take 10 mg by mouth in the morning and at bedtime.    [provider]  mirtazapine (REMERON) 15 MG tablet Take 1 tablet (15 mg total) by mouth at bedtime. Patient not taking: Reported on 05/31/2023 01/23/21   Lenard Lance, FNP      Allergies    Patient has no known allergies.    Review of Systems   Review of Systems  Constitutional:  Negative for activity change, appetite change and fever.  HENT:  Negative for congestion and rhinorrhea.   Respiratory:  Negative for cough, chest tightness and shortness of breath.   Cardiovascular:  Negative for chest pain.  Gastrointestinal:  Negative for abdominal pain, nausea and vomiting.  Genitourinary:  Negative for dysuria and hematuria.  Musculoskeletal:  Negative for arthralgias and myalgias.  Skin:  Negative for rash.  Neurological:  Negative for dizziness, weakness and headaches.   all other systems are negative except as noted in the HPI and PMH.    Physical Exam Updated Vital Signs Ht 6' (1.829 m)   Wt 95 kg   BMI 28.40 kg/m  Physical Exam Vitals and nursing note reviewed.  Constitutional:      General: He is not in acute distress.    Appearance: He is well-developed. He is not ill-appearing.     Comments: Grinding teeth  HENT:     Head:  Normocephalic and atraumatic.     Mouth/Throat:     Pharynx: No oropharyngeal exudate.  Eyes:     Conjunctiva/sclera: Conjunctivae normal.     Pupils: Pupils are equal, round, and reactive to light.  Neck:     Comments: No meningismus. Cardiovascular:     Rate and Rhythm: Normal rate and regular rhythm.     Heart sounds: Normal heart sounds. No murmur heard. Pulmonary:     Effort: Pulmonary effort is normal. No respiratory distress.     Breath sounds: Normal breath sounds.  Abdominal:     Palpations: Abdomen is soft.     Tenderness: There is no abdominal tenderness. There is no guarding or rebound.  Musculoskeletal:        General: No tenderness. Normal range  of motion.     Cervical back: Normal range of motion and neck supple.     Comments: Intact distal pulses  Skin:    General: Skin is warm.  Neurological:     Mental Status: He is alert and oriented to person, place, and time.     Cranial Nerves: No cranial nerve deficit.     Motor: No abnormal muscle tone.     Coordination: Coordination normal.     Comments:  5/5 strength throughout. CN 2-12 intact.Equal grip strength.   Psychiatric:        Behavior: Behavior normal.     ED Results / Procedures / Treatments   Labs (all labs ordered are listed, but only abnormal results are displayed) Labs Reviewed  CBG MONITORING, ED - Abnormal; Notable for the following components:      Result Value   Glucose-Capillary 128 (*)    All other components within normal limits  COMPREHENSIVE METABOLIC PANEL  ETHANOL  SALICYLATE LEVEL  ACETAMINOPHEN LEVEL  RAPID URINE DRUG SCREEN, HOSP PERFORMED  CBC WITH DIFFERENTIAL/PLATELET    EKG None  Radiology No results found.  Procedures Procedures  {Document cardiac monitor, telemetry assessment procedure when appropriate:1}  Medications Ordered in ED Medications - No data to display  ED Course/ Medical Decision Making/ A&P   {   Click here for ABCD2, HEART and other calculatorsREFRESH Note before signing :1}                              Medical Decision Making Amount and/or Complexity of Data Reviewed Labs: ordered. Decision-making details documented in ED Course. Radiology: ordered and independent interpretation performed. Decision-making details documented in ED Course. ECG/medicine tests: ordered and independent interpretation performed. Decision-making details documented in ED Course.   Suicidal thoughts with plan to walk into traffic.  Vital stable.  No distress.  He is calm and cooperative currently.  Will obtain screening labs.  {Document critical care time when appropriate:1} {Document review of labs and clinical decision tools  ie heart score, Chads2Vasc2 etc:1}  {Document your independent review of radiology images, and any outside records:1} {Document your discussion with family members, caretakers, and with consultants:1} {Document social determinants of health affecting pt's care:1} {Document your decision making why or why not admission, treatments were needed:1} Final Clinical Impression(s) / ED Diagnoses Final diagnoses:  None    Rx / DC Orders ED Discharge Orders     None

## 2023-06-12 NOTE — BH Assessment (Signed)
 TTS consult will be completed by [facility]. [facility] Coordinator will communicate in established chat assessment time and provider name. Thanks

## 2023-06-12 NOTE — BH Assessment (Signed)
 Dr. Elesa Hacker with [facility], attempted to complete TTS assessment, however patient continued to fall asleep. RN will contact when patient is alert and able to complete assessment.

## 2023-06-12 NOTE — ED Notes (Signed)
 Patient refused EKG.

## 2023-06-12 NOTE — ED Notes (Signed)
 Pt dressed in purple scrubs belongings including 3 bags total placed in cabinet labeled hall C at nurse station
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
## 2023-06-13 NOTE — ED Provider Notes (Signed)
 Emergency Medicine Observation Re-evaluation Note  Garrett Arellano is a 48 y.o. male, seen on rounds today.  Pt initially presented to the ED for complaints of Suicidal Currently, the patient is for inpt psychiatric placement in setting of SI, schizophrenia, alcohol abuse.  Currently sleeping. No specific concerns per staff. Did have tachycardia, will reeval.   Physical Exam  BP (!) 130/105   Pulse (!) 113   Temp 98.1 F (36.7 C) (Oral)   Resp (!) 21   Ht 6' (1.829 m)   Wt 95 kg   SpO2 100%   BMI 28.40 kg/m  Physical Exam General: NAD Cardiac: RR Lungs: even unlabored Psych: n/a  ED Course / MDM  EKG:EKG Interpretation Date/Time:  Sunday June 12 2023 20:26:46 EST Ventricular Rate:  93 PR Interval:  158 QRS Duration:  86 QT Interval:  358 QTC Calculation: 445 R Axis:   91  Text Interpretation: Normal sinus rhythm Rightward axis Borderline ECG When compared with ECG of 31-May-2023 08:56, PREVIOUS ECG IS PRESENT No significant change since last tracing Confirmed by Vanetta Mulders [phone]) on 06/12/2023 8:30:09 PM  I have reviewed the labs performed to date as well as medications administered while in observation.  Recent changes in the last 24 hours include none.  Plan  Current plan is for inpt psychiatric placement in setting of SI, schizophrenia, alcohol abuse.Alvira Monday, MD 06/13/23 [phone]

## 2023-06-14 NOTE — ED Provider Notes (Signed)
 Emergency Medicine Observation Re-evaluation Note  Garrett Arellano is a 48 y.o. male, seen on rounds today.  Pt initially presented to the ED for complaints of Suicidal Currently, the patient is for inpt psychiatric placement in setting of SI, schizophrenia, alcohol abuse.   Physical Exam  BP (!) 125/91 (BP Location: Right Arm)   Pulse 82   Temp 98 F (36.7 C) (Oral)   Resp 18   Ht 6' (1.829 m)   Wt 95 kg   SpO2 99%   BMI 28.40 kg/m  Physical Exam General: NAD Cardiac: RR Lungs: even unlabored Psych: n/a  ED Course / MDM  EKG:EKG Interpretation Date/Time:  Sunday June 12 2023 20:26:46 EST Ventricular Rate:  93 PR Interval:  158 QRS Duration:  86 QT Interval:  358 QTC Calculation: 445 R Axis:   91  Text Interpretation: Normal sinus rhythm Rightward axis Borderline ECG When compared with ECG of 31-May-2023 08:56, PREVIOUS ECG IS PRESENT No significant change since last tracing Confirmed by Vanetta Mulders [phone]) on 06/12/2023 8:30:09 PM  I have reviewed the labs performed to date as well as medications administered while in observation.  Recent changes in the last 24 hours include none.  Plan  Current plan is for inpt psychiatric placement in setting of SI, schizophrenia, alcohol abuse.   Accepted for transfer to [facility]. Stable for transfer.    Alvira Monday, MD 06/14/23 2140

## 2023-06-14 NOTE — ED Notes (Signed)
 Pt has been accepted to [facility] TODAY 06/14/2023.Bed assignment: Main campus  Pt meets inpatient criteria per Dahlia Byes NP  Attending Physician will be Loni Beckwith, MD  Report can be called to: [phone] (this is a pager, please leave call-back number when giving report)  Pt can arrive ASAP   Care Team Notified: Dahlia Byes NP, Monia Pouch, Jacquelynn Cree NT   [place] Lollie Gunner LCSW-A   06/14/2023 1:03 PM

## 2023-06-14 NOTE — Progress Notes (Addendum)
 Pt has been accepted to [facility] TODAY 06/14/2023.Bed assignment: Main campus  Pt meets inpatient criteria per Dahlia Byes NP  Attending Physician will be Loni Beckwith, MD  Report can be called to: [phone] (this is a pager, please leave call-back number when giving report)  Pt can arrive ASAP   Care Team Notified: Dahlia Byes NP, Monia Pouch, Jacquelynn Cree NT   [place] Lollie Gunner LCSW-A   06/14/2023 1:03 PM

## 2023-06-14 NOTE — ED Notes (Signed)
 Report given to [facility].

## 2023-06-14 NOTE — Progress Notes (Signed)
 06/14/2023  1314  Called Sheriff and left message with patient's information and that he needs to be transferred to [facility] today.

## 2023-06-14 NOTE — Progress Notes (Signed)
 06/14/2023  1400  Called safe transport to transport patient.

## 2023-06-14 NOTE — Progress Notes (Signed)
 LCSW Progress Note  [id number]   Garrett Arellano  06/14/2023  12:38 PM  Description:   Inpatient Psychiatric Referral  Patient was recommended inpatient per Dahlia Byes NP. There are no available beds at [facility], per [facility] Rona Ravens RN. Patient was referred to the following out of network facilities:    Destination  Service Provider Address Phone Fax  [facility] [address] [place] [place] [address] [phone]  [facility] [address] [place] [address] [phone]  [facility] [address] [place] [address] [phone]  [facility] Patient Placement [facility], [place] [phone]  [facility] [address] [place] [address] [phone]  [facility] [address] [place] [address] [phone]  [facility] [address] [place] [address] [phone]  [facility] EFAX [address] [place] [phone]  [facility] [address], [place] [address] [phone]  [facility] [address] [place] [address] [phone]  [facility] [address] [place], [place] [address] [phone]  [facility] [address] [place] [address] [phone]  [facility] [address], [place] [address] [phone]  [facility] [address] [place] [address] [phone]  [facility] [address], [place] [address] [phone]  [facility] [address], [place] [address] [phone]  [facility] [address], [place] [address] [phone]      Situation ongoing, CSW to continue following and update chart as more information becomes available.      [name], MSW, LCSW  06/14/2023 12:38 PM

## 2023-06-14 NOTE — ED Notes (Signed)
 Left voicemail for [facility] to call back to give report. Advised patient en route.

## 2023-06-20 ENCOUNTER — Ambulatory Visit (HOSPITAL_COMMUNITY): Payer: MEDICAID | Admitting: Psychiatry

## 2023-06-20 ENCOUNTER — Other Ambulatory Visit: Payer: Self-pay

## 2023-06-20 ENCOUNTER — Emergency Department (HOSPITAL_COMMUNITY): Payer: MEDICAID

## 2023-06-20 ENCOUNTER — Emergency Department (HOSPITAL_COMMUNITY)
Admission: EM | Admit: 2023-06-20 | Discharge: 2023-06-21 | Disposition: A | Discharge status: Home / Self Care | Payer: MEDICAID | Attending: Emergency Medicine | Admitting: Emergency Medicine

## 2023-06-20 ENCOUNTER — Encounter (HOSPITAL_COMMUNITY): Payer: Self-pay | Admitting: Emergency Medicine

## 2023-06-20 DIAGNOSIS — R45851 Suicidal ideations: Secondary | ICD-10-CM | POA: Insufficient documentation

## 2023-06-20 DIAGNOSIS — M25562 Pain in left knee: Secondary | ICD-10-CM | POA: Insufficient documentation

## 2023-06-20 DIAGNOSIS — F17210 Nicotine dependence, cigarettes, uncomplicated: Secondary | ICD-10-CM | POA: Insufficient documentation

## 2023-06-20 DIAGNOSIS — F1220 Cannabis dependence, uncomplicated: Secondary | ICD-10-CM | POA: Diagnosis present

## 2023-06-20 DIAGNOSIS — F209 Schizophrenia, unspecified: Secondary | ICD-10-CM | POA: Diagnosis present

## 2023-06-20 DIAGNOSIS — R000 Tachycardia, unspecified: Secondary | ICD-10-CM | POA: Insufficient documentation

## 2023-06-20 DIAGNOSIS — F1414 Cocaine abuse with cocaine-induced mood disorder: Secondary | ICD-10-CM | POA: Diagnosis present

## 2023-06-20 LAB — COMPREHENSIVE METABOLIC PANEL
ALT: 21 U/L (ref 0–44)
AST: 15 U/L (ref 15–41)
Albumin: 3.5 g/dL (ref 3.5–5.0)
Alkaline Phosphatase: 20 U/L — ABNORMAL LOW (ref 38–126)
Anion gap: 10 (ref 5–15)
BUN: 9 mg/dL (ref 6–20)
CO2: 27 mmol/L (ref 22–32)
Calcium: 9.6 mg/dL (ref 8.9–10.3)
Chloride: 98 mmol/L (ref 98–111)
Creatinine, Ser: 0.92 mg/dL (ref 0.61–1.24)
GFR, Estimated: >60 mL/min (ref >60)
Glucose, Bld: 147 mg/dL — ABNORMAL HIGH (ref 70–99)
Potassium: 4.7 mmol/L (ref 3.5–5.1)
Sodium: 135 mmol/L (ref 135–145)
Total Bilirubin: 1.1 mg/dL (ref 0.0–1.2)
Total Protein: 7.2 g/dL (ref 6.5–8.1)

## 2023-06-20 LAB — SALICYLATE LEVEL: Salicylate Lvl: <7 mg/dL — ABNORMAL LOW (ref 7.0–30.0)

## 2023-06-20 LAB — ACETAMINOPHEN LEVEL: Acetaminophen (Tylenol), Serum: <10 ug/mL — ABNORMAL LOW (ref 10–30)

## 2023-06-20 LAB — CBC
HCT: 37.6 % — ABNORMAL LOW (ref 39.0–52.0)
Hemoglobin: 12.3 g/dL — ABNORMAL LOW (ref 13.0–17.0)
MCH: 28 pg (ref 26.0–34.0)
MCHC: 32.7 g/dL (ref 30.0–36.0)
MCV: 85.5 fL (ref 80.0–100.0)
Platelets: 292 10*3/uL (ref 150–400)
RBC: 4.4 MIL/uL (ref 4.22–5.81)
RDW: 13.4 % (ref 11.5–15.5)
WBC: 7.7 10*3/uL (ref 4.0–10.5)
nRBC: 0 % (ref 0.0–0.2)

## 2023-06-20 LAB — ETHANOL: Alcohol, Ethyl (B): <10 mg/dL (ref <10)

## 2023-06-20 MED ORDER — BENZTROPINE MESYLATE 0.5 MG PO TABS: 1.0000 mg | ORAL_TABLET | Freq: Two times a day (BID) | ORAL | Status: DC | PRN

## 2023-06-20 NOTE — ED Notes (Signed)
 Patients was moved to Garrett Arellano.  Patients belongings (both bags) were moved to the cabinet labeled [name] at the nurses station.

## 2023-06-20 NOTE — ED Triage Notes (Signed)
 Patient BIB [facility] c/o left knee pain and lower back pain.  Patient denies falls, and denies injuries.  Patient also endorses SI.  Denies HI.

## 2023-06-20 NOTE — ED Notes (Addendum)
 Patient is dressed out in blue pants and purple shirt.   Patients belongings were placed in 2 patients belongings bag and then both were placed in the cabinet at the triage nurses station near the ice machine.    Patients belongs: shirt, jacket, pants, belt, and tennis shoes    Patient stated he left his phone and wallet at home.

## 2023-06-20 NOTE — Clinical Social Work Placement (Signed)
 Social Work Disposition Note:   Per [facility] provider Dahlia Byes, NP, and after the patient refused to engage in the evaluation and requested the provider to leave. As a result of the patient's lack of participation in the assessment, it was decided that he will be discharged tomorrow 06/21/2023. The patient's brother, Garrett Arellano, was notified of the situation and informed about the discharge plan.

## 2023-06-20 NOTE — ED Notes (Signed)
 Unsuccessful with blood draw, patient refusing further blood draws.

## 2023-06-20 NOTE — Consult Note (Signed)
 This provider attempted to evaluate this patient but he refused to engage in the evaluation stating he does not want to talk.  Patient was awake, alert and eating lunch.  He did not make eye contact with Provider.  Patient was discharged to [facility] in [place] from this ER 8 days ago for inpatient Psychiatry care.  He did not remember how many days he spent at [facility] and when he was discharged.  Patient abruptly ended our discussion and asked provider to leave. During his last visit 12 days ago to the ER he did the same thing.  Patient did not interact with providers.  All he dis was sleep, eat and in some occasions he took his Medications and some times refused to take them. Provider called his brother Delfin Squillace and informed him that patient is back in the ER and to gather collateral information from him.  [name] was very surprised that  patient came back to the ER. He stated that patient has his own apartment and was there this weekend.Marland Kitchen  He was informed that since patient has refused to engage in assessment we will discharge him tomorrow,
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
## 2023-06-21 DIAGNOSIS — F209 Schizophrenia, unspecified: Secondary | ICD-10-CM

## 2023-06-21 LAB — RAPID URINE DRUG SCREEN, HOSP PERFORMED
Amphetamines: NOT DETECTED
Barbiturates: NOT DETECTED
Benzodiazepines: NOT DETECTED
Cocaine: POSITIVE — AB
Opiates: NOT DETECTED
Tetrahydrocannabinol: POSITIVE — AB

## 2023-06-21 MED ORDER — ACETAMINOPHEN 500 MG PO TABS
1000.0000 mg | ORAL_TABLET | Freq: Once | ORAL | Status: AC
  Administered 2023-06-21: 1000 mg via ORAL
  Filled 2023-06-21: qty 2

## 2023-06-21 NOTE — ED Notes (Signed)
 Pt refused crutches.
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
## 2023-06-21 NOTE — ED Provider Notes (Signed)
 Emergency Medicine Observation Re-evaluation Note  Garrett Arellano is a 48 y.o. male, seen on rounds today.  Pt initially presented to the ED for complaints of Knee Pain Currently, the patient is in bed, he has been evaluated by psychiatry.  Physical Exam  BP 122/84 (BP Location: Right Arm)   Pulse 89   Temp 98.4 F (36.9 C) (Oral)   Resp 16   Wt 96 kg   SpO2 97%   BMI 28.70 kg/m  Physical Exam Knee-swelling appears improved from yesterday, no crepitus, no erythema.  ED Course / MDM  EKG:   I have reviewed the labs performed to date as well as medications administered while in observation.  Recent changes in the last 24 hours include evaluation by psychiatry.  Psychiatry recommending discharge, outpatient follow-up.  I am agreeable with this plan.  Plan  Current plan is for discharge.  Provided patient with Tylenol.  Have given him crutches for knee, orthopedic follow-up.Arletha Pili, DO 06/21/23 [phone]

## 2023-06-21 NOTE — Discharge Instructions (Signed)
 While you are in the emergency room, you have blood work done, and x-ray of your knee.  These tests were normal.  I have provided you with crutches that he should use to keep some weight off of your knee for the next few days while it heals.  You can take Motrin and Tylenol.  Please continue taking all of your medications as prescribed.

## 2023-06-28 ENCOUNTER — Emergency Department (HOSPITAL_COMMUNITY)
Admission: EM | Admit: 2023-06-28 | Discharge: 2023-06-29 | Disposition: A | Discharge status: Home / Self Care | Payer: MEDICAID | Attending: Emergency Medicine | Admitting: Emergency Medicine

## 2023-06-28 ENCOUNTER — Encounter (HOSPITAL_COMMUNITY): Payer: Self-pay

## 2023-06-28 ENCOUNTER — Other Ambulatory Visit: Payer: Self-pay

## 2023-06-28 DIAGNOSIS — F1410 Cocaine abuse, uncomplicated: Secondary | ICD-10-CM | POA: Insufficient documentation

## 2023-06-28 DIAGNOSIS — R45851 Suicidal ideations: Secondary | ICD-10-CM | POA: Diagnosis not present

## 2023-06-28 DIAGNOSIS — F1910 Other psychoactive substance abuse, uncomplicated: Secondary | ICD-10-CM | POA: Diagnosis not present

## 2023-06-28 DIAGNOSIS — F419 Anxiety disorder, unspecified: Secondary | ICD-10-CM | POA: Diagnosis not present

## 2023-06-28 DIAGNOSIS — F14920 Cocaine use, unspecified with intoxication, uncomplicated: Secondary | ICD-10-CM

## 2023-06-28 DIAGNOSIS — Z765 Malingerer [conscious simulation]: Secondary | ICD-10-CM | POA: Diagnosis not present

## 2023-06-28 LAB — COMPREHENSIVE METABOLIC PANEL
ALT: 18 U/L (ref 0–44)
AST: 17 U/L (ref 15–41)
Albumin: 4.2 g/dL (ref 3.5–5.0)
Alkaline Phosphatase: 28 U/L — ABNORMAL LOW (ref 38–126)
Anion gap: 11 (ref 5–15)
BUN: 6 mg/dL (ref 6–20)
CO2: 25 mmol/L (ref 22–32)
Calcium: 10 mg/dL (ref 8.9–10.3)
Chloride: 103 mmol/L (ref 98–111)
Creatinine, Ser: 1.01 mg/dL (ref 0.61–1.24)
GFR, Estimated: >60 mL/min (ref >60)
Glucose, Bld: 96 mg/dL (ref 70–99)
Potassium: 3.6 mmol/L (ref 3.5–5.1)
Sodium: 139 mmol/L (ref 135–145)
Total Bilirubin: 0.8 mg/dL (ref 0.0–1.2)
Total Protein: 8.3 g/dL — ABNORMAL HIGH (ref 6.5–8.1)

## 2023-06-28 LAB — SALICYLATE LEVEL: Salicylate Lvl: <7 mg/dL — ABNORMAL LOW (ref 7.0–30.0)

## 2023-06-28 LAB — CBC
HCT: 42.4 % (ref 39.0–52.0)
Hemoglobin: 13.5 g/dL (ref 13.0–17.0)
MCH: 26.9 pg (ref 26.0–34.0)
MCHC: 31.8 g/dL (ref 30.0–36.0)
MCV: 84.5 fL (ref 80.0–100.0)
Platelets: 507 10*3/uL — ABNORMAL HIGH (ref 150–400)
RBC: 5.02 MIL/uL (ref 4.22–5.81)
RDW: 13.2 % (ref 11.5–15.5)
WBC: 5.9 10*3/uL (ref 4.0–10.5)
nRBC: 0 % (ref 0.0–0.2)

## 2023-06-28 LAB — ETHANOL: Alcohol, Ethyl (B): 142 mg/dL — ABNORMAL HIGH (ref <10)

## 2023-06-28 LAB — ACETAMINOPHEN LEVEL: Acetaminophen (Tylenol), Serum: <10 ug/mL — ABNORMAL LOW (ref 10–30)

## 2023-06-28 MED ORDER — ZIPRASIDONE MESYLATE 20 MG IM SOLR: 20.0000 mg | INTRAMUSCULAR | Status: DC | PRN

## 2023-06-28 MED ORDER — ACETAMINOPHEN 325 MG PO TABS: 650.0000 mg | ORAL_TABLET | ORAL | Status: DC | PRN

## 2023-06-28 MED ORDER — CHLORPROMAZINE HCL 25 MG PO TABS
25.0000 mg | ORAL_TABLET | Freq: Two times a day (BID) | ORAL | Status: DC
  Administered 2023-06-29 (×2): 25 mg via ORAL
  Filled 2023-06-28 (×2): qty 1

## 2023-06-28 MED ORDER — HALOPERIDOL 5 MG PO TABS
15.0000 mg | ORAL_TABLET | Freq: Two times a day (BID) | ORAL | Status: DC
  Administered 2023-06-29 (×2): 15 mg via ORAL
  Filled 2023-06-28 (×2): qty 3

## 2023-06-28 MED ORDER — RISPERIDONE 0.5 MG PO TBDP: 2.0000 mg | ORAL_TABLET | Freq: Three times a day (TID) | ORAL | Status: DC | PRN

## 2023-06-28 MED ORDER — MIRTAZAPINE 7.5 MG PO TABS
15.0000 mg | ORAL_TABLET | Freq: Every day | ORAL | Status: DC
  Administered 2023-06-29: 15 mg via ORAL
  Filled 2023-06-28: qty 2

## 2023-06-28 MED ORDER — LORAZEPAM 1 MG PO TABS: 1.0000 mg | ORAL_TABLET | ORAL | Status: DC | PRN

## 2023-06-28 MED ORDER — ZOLPIDEM TARTRATE 5 MG PO TABS: 5.0000 mg | ORAL_TABLET | Freq: Every evening | ORAL | Status: DC | PRN

## 2023-06-28 MED ORDER — BENZTROPINE MESYLATE 1 MG PO TABS: 1.0000 mg | ORAL_TABLET | Freq: Two times a day (BID) | ORAL | Status: DC | PRN

## 2023-06-28 NOTE — ED Notes (Signed)
 Patient has been changed out into burgundy scrubs. Two belongings bags are at triage desk.

## 2023-06-28 NOTE — ED Triage Notes (Addendum)
 [place] [facility] [place] [facility] [place] [address] with reports of SI and HI. Pt states that he is going to run into traffic.

## 2023-06-28 NOTE — ED Provider Notes (Addendum)
 [place] EMERGENCY DEPARTMENT AT [facility] Provider Note   CSN: [id number] Arrival date & time: 06/28/23  2115     History  Chief Complaint  Patient presents with   Suicidal   Homicidal    Garrett Arellano is a 48 y.o. male.  HPI     48 year old patient comes in with chief complaint of SI, HI. Patient has a history of polysubstance use, acute psychosis, schizophrenia.  Patient comes in with chief complaint of SI, HI. Patient says that he wants to run in front of the traffic.  He is not taking his medications.  He is hearing babies cry.  Home Medications Prior to Admission medications   Medication Sig Start Date End Date Taking? Authorizing Provider  benztropine (COGENTIN) 1 MG tablet Take 1 tablet (1 mg total) by mouth 2 (two) times daily as needed. Patient not taking: Reported on 06/20/2023 01/23/21   Lenard Lance, FNP  chlorproMAZINE (THORAZINE) 25 MG tablet Take 1 tablet (25 mg total) by mouth 2 (two) times daily. Patient not taking: Reported on 06/20/2023 01/23/21   Lenard Lance, FNP  haloperidol (HALDOL) 10 MG tablet Take 1.5 tablets (15 mg total) by mouth 2 (two) times daily. Patient not taking: Reported on 06/20/2023 01/23/21 06/20/23  Lenard Lance, FNP  mirtazapine (REMERON) 15 MG tablet Take 1 tablet (15 mg total) by mouth at bedtime. Patient not taking: Reported on 06/20/2023 01/23/21   Lenard Lance, FNP      Allergies    Patient has no known allergies.    Review of Systems   Review of Systems  Physical Exam Updated Vital Signs BP (!) 154/98 (BP Location: Right Arm)   Pulse (!) 105   Resp 18   Ht 6' (1.829 m)   Wt 96 kg   SpO2 96%   BMI 28.70 kg/m  Physical Exam Vitals and nursing note reviewed.  Constitutional:      Appearance: He is well-developed.  HENT:     Head: Atraumatic.  Cardiovascular:     Rate and Rhythm: Normal rate.  Pulmonary:     Effort: Pulmonary effort is normal.  Musculoskeletal:     Cervical back: Neck supple.   Skin:    General: Skin is warm.  Neurological:     Mental Status: He is alert and oriented to person, place, and time.  Psychiatric:        Attention and Perception: He is inattentive.        Mood and Affect: Mood is anxious.        Behavior: Behavior is aggressive.     ED Results / Procedures / Treatments   Labs (all labs ordered are listed, but only abnormal results are displayed) Labs Reviewed  COMPREHENSIVE METABOLIC PANEL - Abnormal; Notable for the following components:      Result Value   Total Protein 8.3 (*)    Alkaline Phosphatase 28 (*)    All other components within normal limits  ETHANOL - Abnormal; Notable for the following components:   Alcohol, Ethyl (B) 142 (*)    All other components within normal limits  SALICYLATE LEVEL - Abnormal; Notable for the following components:   Salicylate Lvl <7.0 (*)    All other components within normal limits  ACETAMINOPHEN LEVEL - Abnormal; Notable for the following components:   Acetaminophen (Tylenol), Serum <10 (*)    All other components within normal limits  CBC - Abnormal; Notable for the following components:   Platelets  507 (*)    All other components within normal limits  RAPID URINE DRUG SCREEN, HOSP PERFORMED  CBG MONITORING, ED    EKG None  Radiology No results found.  Procedures Procedures    Medications Ordered in ED Medications  risperiDONE (RISPERDAL M-TABS) disintegrating tablet 2 mg (has no administration in time range)    And  LORazepam (ATIVAN) tablet 1 mg (has no administration in time range)    And  ziprasidone (GEODON) injection 20 mg (has no administration in time range)  zolpidem (AMBIEN) tablet 5 mg (has no administration in time range)  acetaminophen (TYLENOL) tablet 650 mg (has no administration in time range)    ED Course/ Medical Decision Making/ A&P                                 Medical Decision Making Amount and/or Complexity of Data Reviewed Labs:  ordered.  Risk OTC drugs. Prescription drug management.   Patient comes to the ER with cc of SI. Patient has pertinent past medical history of schizophrenia, substance use disorder. Currently patient is agitated, indicates that he is hearing voices and he has plans to run in front of the traffic.  Pt denies nausea, emesis, fevers, chills, chest pains, shortness of breath, headaches, abdominal pain, uti like symptoms and patient has no active medical complaints. I have reviewed previous encounters for this patient and reviewed their primary medications.  Patient has been seen in the ER 3 times in February.  It appears that psychiatry service is concerned about malingering and psychosis because of his substance use.  Unfortunately, patient appears to be intoxicated right now, not very reliable, and psychotic.  He is not clinically sober for discharge.  Will place him in psych hold for now.  Dispo per psychiatry assessment.  Patient is medically cleared.  Differential diagnosis considered for this patient includes: Depression Bipolar disorder Schizophrenia Substance abuse Suicidal ideation Acute withdrawal  Appropriate labs have been ordered.  Ethanol level elevated. Patient is medically cleared for psychiatric evaluation.  EKG is pending at this time, nursing staff advised to completed as soon as possible.  Home meds ordered.  Final Clinical Impression(s) / ED Diagnoses Final diagnoses:  Suicidal ideation    Rx / DC Orders ED Discharge Orders     None         Derwood Kaplan, MD 06/28/23 1610    Derwood Kaplan, MD 06/28/23 2327

## 2023-06-28 NOTE — B[facility] Counselor (Signed)
 TTS Note:   @11 :15 PM-Patient was deferred to [facility] Telehealth Care Coordinator (TCC), Marylene Land, at [phone]. The IRIS Care Coordinator will notify the patient's care team when the IRIS provider is ready to initiate the telehealth assessment. If there are any questions, please reach out to the IRIS Coordinator.

## 2023-06-29 DIAGNOSIS — Z765 Malingerer [conscious simulation]: Secondary | ICD-10-CM

## 2023-06-29 LAB — RESP PANEL BY RT-PCR (RSV, FLU A&B, COVID)  RVPGX2
Influenza A by PCR: NEGATIVE
Influenza B by PCR: NEGATIVE
Resp Syncytial Virus by PCR: NEGATIVE
SARS Coronavirus 2 by RT PCR: NEGATIVE

## 2023-06-29 LAB — RAPID URINE DRUG SCREEN, HOSP PERFORMED
Amphetamines: NOT DETECTED
Barbiturates: NOT DETECTED
Benzodiazepines: NOT DETECTED
Cocaine: POSITIVE — AB
Opiates: NOT DETECTED
Tetrahydrocannabinol: POSITIVE — AB

## 2023-06-29 LAB — CBG MONITORING, ED: Glucose-Capillary: 209 mg/dL — ABNORMAL HIGH (ref 70–99)

## 2023-06-29 LAB — TROPONIN I (HIGH SENSITIVITY): Troponin I (High Sensitivity): 6 ng/L (ref <18)

## 2023-06-29 NOTE — ED Provider Notes (Signed)
 Emergency Medicine Observation Re-evaluation Note  Garrett Arellano is a 48 y.o. male, seen on rounds today.  Pt initially presented to the ED for complaints of Suicidal and Homicidal Currently, the patient is sleeping.  Physical Exam  BP (!) 147/98 (BP Location: Right Arm)   Pulse (!) 115   Temp 98.5 F (36.9 C) (Oral)   Resp 16   Ht 6' (1.829 m)   Wt 96 kg   SpO2 100%   BMI 28.70 kg/m  Physical Exam General: NAD Cardiac: Mild tachycardia Lungs: No respiratory distress   ED Course / MDM  EKG:EKG Interpretation Date/Time:  Wednesday June 29 2023 00:30:48 EST Ventricular Rate:  117 PR Interval:  166 QRS Duration:  88 QT Interval:  322 QTC Calculation: 449 R Axis:   82  Text Interpretation: Sinus tachycardia Possible Left atrial enlargement Minimal voltage criteria for LVH, may be normal variant ( Sokolow-Lyon ) Cannot rule out Anterior infarct , age undetermined Abnormal ECG When compared with ECG of 12-Jun-2023 20:26, PREVIOUS ECG IS PRESENT No significant change was found Confirmed by Glynn Octave [phone]) on 06/29/2023 12:39:45 AM  I have reviewed the labs performed to date as well as medications administered while in observation.    Plan  Current plan is for psychiatric evaluation  Per my review of the records the patient is a longstanding history of polysubstance use including ongoing cocaine use, as a several psychiatric evaluations.  He was reportedly acutely intoxicated last night, with no emergent findings on his medical workup.  He is still pending a UDS but 8 days ago and on prior urine screens he has persistently had cocaine in his system as well as THC.  Patient reportedly had some hallucinations yesterday upon arrival for which she is now pending a psychiatric evaluation.  He is not under IVC    Terald Sleeper, MD 06/29/23 [phone]
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
## 2023-06-29 NOTE — BH Assessment (Signed)
 [facility] scheduled pt's assessment however per Eden Emms, RN: "hey, pt has been irate and we were able to get him medicated and he is finally sleep, could we push back the time?"   Per IRIS coordinator: "Yes of course, please call us at [phone] when he is ready thank you."    Redmond Pulling, MS, [facility], [facility] Triage Specialist [phone]

## 2023-06-29 NOTE — ED Notes (Signed)
 Patient is alert & oriented x4. Calm and cooperative.Denies current SI but reported (while repeatedly pointing to his head) that he told "the people" that he was suicidal.

## 2023-06-29 NOTE — ED Provider Notes (Signed)
 Patient is reasonably safe and stable for outpatient follow-up at this time of behavioral health services.  Drug cessation counseling information was also provided.    Terald Sleeper, MD 06/29/23 [phone]

## 2023-06-29 NOTE — Discharge Instructions (Signed)
 Encourage you to continue taking your normal medications prescribed including your mental health medicine.  Please try to avoid marijuana and cocaine.  These types of drugs can worsen your mood and affect your mental health.

## 2023-06-29 NOTE — ED Notes (Signed)
 Pt has refused being stuck for second trop , MD Rancor made aware, no new orders at this time

## 2023-07-18 ENCOUNTER — Other Ambulatory Visit: Payer: Self-pay

## 2023-07-18 ENCOUNTER — Emergency Department (HOSPITAL_COMMUNITY)
Admission: EM | Admit: 2023-07-18 | Discharge: 2023-07-19 | Disposition: A | Discharge status: Other Acute Inpatient Hospital | Payer: MEDICAID | Attending: Emergency Medicine | Admitting: Emergency Medicine

## 2023-07-18 DIAGNOSIS — F29 Unspecified psychosis not due to a substance or known physiological condition: Secondary | ICD-10-CM | POA: Diagnosis not present

## 2023-07-18 DIAGNOSIS — Z79899 Other long term (current) drug therapy: Secondary | ICD-10-CM | POA: Diagnosis not present

## 2023-07-18 DIAGNOSIS — F259 Schizoaffective disorder, unspecified: Secondary | ICD-10-CM | POA: Insufficient documentation

## 2023-07-18 DIAGNOSIS — R45851 Suicidal ideations: Secondary | ICD-10-CM

## 2023-07-18 DIAGNOSIS — R4589 Other symptoms and signs involving emotional state: Secondary | ICD-10-CM | POA: Insufficient documentation

## 2023-07-18 DIAGNOSIS — F250 Schizoaffective disorder, bipolar type: Secondary | ICD-10-CM | POA: Diagnosis not present

## 2023-07-18 LAB — COMPREHENSIVE METABOLIC PANEL
ALT: 16 U/L (ref 0–44)
AST: 23 U/L (ref 15–41)
Albumin: 3.9 g/dL (ref 3.5–5.0)
Alkaline Phosphatase: 31 U/L — ABNORMAL LOW (ref 38–126)
Anion gap: 10 (ref 5–15)
BUN: 8 mg/dL (ref 6–20)
CO2: 24 mmol/L (ref 22–32)
Calcium: 8.9 mg/dL (ref 8.9–10.3)
Chloride: 102 mmol/L (ref 98–111)
Creatinine, Ser: 0.93 mg/dL (ref 0.61–1.24)
GFR, Estimated: >60 mL/min (ref >60)
Glucose, Bld: 134 mg/dL — ABNORMAL HIGH (ref 70–99)
Potassium: 4.5 mmol/L (ref 3.5–5.1)
Sodium: 136 mmol/L (ref 135–145)
Total Bilirubin: 1.6 mg/dL — ABNORMAL HIGH (ref 0.0–1.2)
Total Protein: 7.3 g/dL (ref 6.5–8.1)

## 2023-07-18 LAB — CBC WITH DIFFERENTIAL/PLATELET
Abs Immature Granulocytes: 0.01 10*3/uL (ref 0.00–0.07)
Basophils Absolute: 0.1 10*3/uL (ref 0.0–0.1)
Basophils Relative: 1 %
Eosinophils Absolute: 0.1 10*3/uL (ref 0.0–0.5)
Eosinophils Relative: 2 %
HCT: 47 % (ref 39.0–52.0)
Hemoglobin: 14.4 g/dL (ref 13.0–17.0)
Immature Granulocytes: 0 %
Lymphocytes Relative: 52 %
Lymphs Abs: 2.5 10*3/uL (ref 0.7–4.0)
MCH: 27.4 pg (ref 26.0–34.0)
MCHC: 30.6 g/dL (ref 30.0–36.0)
MCV: 89.4 fL (ref 80.0–100.0)
Monocytes Absolute: 0.4 10*3/uL (ref 0.1–1.0)
Monocytes Relative: 8 %
Neutro Abs: 1.8 10*3/uL (ref 1.7–7.7)
Neutrophils Relative %: 37 %
Platelets: 220 10*3/uL (ref 150–400)
RBC: 5.26 MIL/uL (ref 4.22–5.81)
RDW: 14.5 % (ref 11.5–15.5)
WBC: 4.9 10*3/uL (ref 4.0–10.5)
nRBC: 0 % (ref 0.0–0.2)

## 2023-07-18 LAB — RAPID URINE DRUG SCREEN, HOSP PERFORMED
Amphetamines: NOT DETECTED
Barbiturates: NOT DETECTED
Benzodiazepines: NOT DETECTED
Cocaine: POSITIVE — AB
Opiates: NOT DETECTED
Tetrahydrocannabinol: POSITIVE — AB

## 2023-07-18 LAB — SALICYLATE LEVEL: Salicylate Lvl: <7 mg/dL — ABNORMAL LOW (ref 7.0–30.0)

## 2023-07-18 LAB — ACETAMINOPHEN LEVEL: Acetaminophen (Tylenol), Serum: <10 ug/mL — ABNORMAL LOW (ref 10–30)

## 2023-07-18 LAB — ETHANOL: Alcohol, Ethyl (B): <10 mg/dL (ref <10)

## 2023-07-18 MED ORDER — BENZTROPINE MESYLATE 1 MG PO TABS: 1.0000 mg | ORAL_TABLET | Freq: Two times a day (BID) | ORAL | Status: DC | PRN

## 2023-07-18 MED ORDER — MIRTAZAPINE 15 MG PO TABS
15.0000 mg | ORAL_TABLET | Freq: Every day | ORAL | Status: DC
Start: 2023-07-18 — End: 2023-07-19
  Administered 2023-07-18: 15 mg via ORAL
  Filled 2023-07-18: qty 1

## 2023-07-18 MED ORDER — HALOPERIDOL 5 MG PO TABS
15.0000 mg | ORAL_TABLET | Freq: Two times a day (BID) | ORAL | Status: DC
  Administered 2023-07-18 – 2023-07-19 (×3): 15 mg via ORAL
  Filled 2023-07-18 (×3): qty 3

## 2023-07-18 NOTE — BH Assessment (Addendum)
 03/24 @0458 : Attempted to complete TTS assessment, PA informed that patient was not ready to complete the assessment. Advised PA to notify TTS when patient is ready so that TTS can complete the consult.

## 2023-07-18 NOTE — Progress Notes (Signed)
 Pt has been accepted to [facility] TODAY 07/18/2023  Bed assignment: Main campus  Pt meets inpatient criteria per: Eligha Bridegroom NP  Attending Physician will be Loni Beckwith, MD  Report can be called to: [phone] (this is a pager, please leave call-back number when giving report)  Pt can arrive ASAP   Care Team Notified: Eligha Bridegroom NP, Rozell Searing RN  [place] Uzziah Rigg LCSW-A   07/18/2023 1:07 PM

## 2023-07-18 NOTE — Progress Notes (Signed)
 LCSW Progress Note  [id number]   Garrett Arellano  07/18/2023  12:59 PM  Description:   Inpatient Psychiatric Referral  Patient was recommended inpatient per Eligha Bridegroom NP). There are no available beds at [facility], per [facility] Rona Ravens RN. Patient was referred to the following out of network facilities:   [facility] Provider Address Phone Fax  [facility] [address] [place] [address] [phone]  [facility] [address] [place] [address] [phone]  [facility] [address] [place], [place] [address] [phone]  [facility] [address] [place] [address] [phone]  [facility] [address], [place] [address] [phone]  [facility] [address], [place] [address] [phone]  [facility] [address], [place] [address] [phone]  [facility] [address], [place] [address] [phone]  [facility] EFAX [address] [place], [place] [phone]  [facility] [address] [place] [address] [phone]  [facility] [address], [place] [address] [phone]      Situation ongoing, CSW to continue following and update chart as more information becomes available.      [place] Geroge Gilliam, MSW, LCSW  07/18/2023 12:59 PM

## 2023-07-18 NOTE — ED Triage Notes (Signed)
 Pt reports that he is SI & HI. He reports having a plan to jump in front of a car. Also reporting auditory hallucinations to harm others.
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
## 2023-07-18 NOTE — ED Notes (Signed)
 [facility] [place], asked for a return call to discuss the patient's IVC status and behaviors.  Her contact number is [phone].  A secure chat message was sent to the patient's nurse relaying the requested information.

## 2023-07-18 NOTE — ED Provider Notes (Signed)
 [place] EMERGENCY DEPARTMENT AT [facility] Provider Note   CSN: [id number] Arrival date & time: 07/18/23  9528     History  Chief Complaint  Patient presents with   Suicidal    Garrett Arellano is a 48 y.o. male history of schizophrenia, suicidal ideation, psychosis, polysubstance abuse, bipolar 1, malingering presented for SI.  Patient states he has been been feeling suicidal the past few days after his mom passed unfortunately.  Patient does not have any active plan and denies thoughts of hurting others but does state that he has persistent thoughts of hurting himself.  Patient does state that he is hearing voices and that the voices are screaming babies.  Patient denies any visual hallucinations.  Patient cannot tell me if he is using other drugs.  Patient denies chest pain shortness of breath vomiting abdominal pain nausea fevers dysuria.   Home Medications Prior to Admission medications   Medication Sig Start Date End Date Taking? Authorizing Provider  benztropine (COGENTIN) 1 MG tablet Take 1 tablet (1 mg total) by mouth 2 (two) times daily as needed. Patient not taking: Reported on 06/20/2023 01/23/21   Lenard Lance, FNP  chlorproMAZINE (THORAZINE) 25 MG tablet Take 1 tablet (25 mg total) by mouth 2 (two) times daily. Patient not taking: Reported on 06/20/2023 01/23/21   Lenard Lance, FNP  haloperidol (HALDOL) 10 MG tablet Take 1.5 tablets (15 mg total) by mouth 2 (two) times daily. Patient not taking: Reported on 06/20/2023 01/23/21 06/20/23  Lenard Lance, FNP  mirtazapine (REMERON) 15 MG tablet Take 1 tablet (15 mg total) by mouth at bedtime. Patient not taking: Reported on 06/20/2023 01/23/21   Lenard Lance, FNP      Allergies    Patient has no known allergies.    Review of Systems   Review of Systems  Physical Exam Updated Vital Signs BP (!) 151/102 (BP Location: Left Arm)   Pulse (!) 102   Temp 98.8 F (37.1 C) (Oral)   Resp 19   Ht 6' (1.829 m)   Wt 96  kg   SpO2 99%   BMI 28.70 kg/m  Physical Exam Vitals reviewed.  Constitutional:      General: He is not in acute distress. HENT:     Head: Normocephalic and atraumatic.  Eyes:     Extraocular Movements: Extraocular movements intact.     Conjunctiva/sclera: Conjunctivae normal.     Pupils: Pupils are equal, round, and reactive to light.  Cardiovascular:     Rate and Rhythm: Normal rate and regular rhythm.     Pulses: Normal pulses.     Heart sounds: Normal heart sounds.     Comments: 2+ bilateral radial/dorsalis pedis pulses with regular rate Pulmonary:     Effort: Pulmonary effort is normal. No respiratory distress.     Breath sounds: Normal breath sounds.  Abdominal:     Palpations: Abdomen is soft.     Tenderness: There is no abdominal tenderness. There is no guarding or rebound.  Musculoskeletal:        General: Normal range of motion.     Cervical back: Normal range of motion and neck supple.     Comments: 5 out of 5 bilateral grip/leg extension strength  Skin:    General: Skin is warm and dry.     Capillary Refill: Capillary refill takes less than 2 seconds.  Neurological:     General: No focal deficit present.     Mental  Status: He is alert and oriented to person, place, and time.     Comments: Sensation intact in all 4 limbs  Psychiatric:     Comments: Endorsing SI but not HI    ED Results / Procedures / Treatments   Labs (all labs ordered are listed, but only abnormal results are displayed) Labs Reviewed  COMPREHENSIVE METABOLIC PANEL  ETHANOL  RAPID URINE DRUG SCREEN, HOSP PERFORMED  CBC WITH DIFFERENTIAL/PLATELET  SALICYLATE LEVEL  ACETAMINOPHEN LEVEL    EKG None  Radiology No results found.  Procedures Procedures    Medications Ordered in ED Medications - No data to display  ED Course/ Medical Decision Making/ A&P                                 Medical Decision Making Amount and/or Complexity of Data Reviewed Labs: ordered.   [name] 48 y.o. presented today for psych evaluation. Working DDx that I considered at this time includes, but not limited to, primary psychosis, substance-induced psychosis, mood disturbance, SI/HI.  R/o DDx: substance-induced psychosis, mood disturbance, HI: These are considered less likely due to history of present illness, physical exam, labs/imaging findings  Review of prior external notes: 06/28/2023 ED  Unique Tests and My Independent Interpretation:  CBC: Unremarkable CMP: Unremarkable UDS: pending Ethanol: pending APAP: pending EKG: Sinus 102 bpm, no signs of ischemia  Social Determinants of Health: EtOH/Substance Abuse  Discussion with Independent Historian: None  Discussion of Management of Tests: None  Risk: Low: based on diagnostic testing/clinical impression and treatment plan  Risk Stratification Score: None  Plan: Patient presented for psychiatric evaluation.  Patient is endorsing SI.  Triage note states the patient is endorsing thoughts of hurting others however patient denies this with me.  Patient does have aggressive past.  Patient does have a history of bipolar 1, schizophrenia for which they are on Haldol, Remeron, Thorazine.  They are not compliant with their medications however patient antilysin he took his medications. On my initial exam, the pt was linear in thought, appropriate in affect, and overall well-appearing.  Vital signs reviewed and reassuring. With the patient's presentation of SI, patient warrants emergent psychiatric consultation.   Patient immediately placed into ED psychiatric hold protocol including suicide precautions, elopement precautions and vital sign monitoring. TTS consulted for further evaluation once patient medically cleared. Medical screening evaluation ordered and reviewed with no obvious medical reason to postpone psychiatric evaluation. Patient is voluntary at this time.  No IVC.  May need to be reassessed if capacity is changing.  Dispo per psych eval.  This chart was dictated using voice recognition software.  Despite best efforts to proofread,  errors can occur which can change the documentation meaning.        Final Clinical Impression(s) / ED Diagnoses Final diagnoses:  Suicidal ideation    Rx / DC Orders ED Discharge Orders     None         Remi Deter 07/18/23 1610    Shon Baton, MD 07/19/23 [phone]

## 2023-07-19 MED ORDER — AMLODIPINE BESYLATE 5 MG PO TABS: 5.0000 mg | ORAL_TABLET | Freq: Every day | ORAL | Status: DC

## 2023-07-19 MED ORDER — AMLODIPINE BESYLATE 5 MG PO TABS
5.0000 mg | ORAL_TABLET | Freq: Once | ORAL | Status: AC
  Administered 2023-07-19: 5 mg via ORAL
  Filled 2023-07-19: qty 1

## 2023-07-19 MED ORDER — CLONIDINE HCL 0.2 MG PO TABS
0.1000 mg | ORAL_TABLET | ORAL | Status: AC
  Administered 2023-07-19: 0.1 mg via ORAL
  Filled 2023-07-19: qty 1

## 2023-07-19 NOTE — ED Notes (Addendum)
 Report was given at 11:54 to Laury Axon, RN from [facility]. Per Lyla Son patent need to have blood pressure taken, to make sure patient is stable to come to the facility. Patient was give a one time dose of Norvasc  5 mg po once. Vital signs at 0823 was BP: 153/103, RR 20,  Pulse 79, temp 98.3, PSO2 100% RA. Patient vital 11:38 BP: 146/92, RR 18, Pulse 79, Spo2 100% RA.

## 2023-07-19 NOTE — ED Notes (Signed)
 Laury Axon, Rn was given update at 11:49 that the patient vital signs: BP 119/81, RR, 18, Pulse 99, Temp 98.5 and Spo2 99% R/a.

## 2023-07-19 NOTE — ED Provider Notes (Signed)
 Emergency Medicine Observation Re-evaluation Note  Garrett Arellano is a 48 y.o. male, seen on rounds today.  Pt initially presented to the ED for complaints of Suicidal Currently, the patient is sleeping but easily arousable.  He has no current complaints..  Physical Exam  BP (!) 153/103 (BP Location: Left Arm)   Pulse 79   Temp 98.3 F (36.8 C) (Oral)   Resp 20   Ht 6' (1.829 m)   Wt 96 kg   SpO2 100%   BMI 28.70 kg/m  Physical Exam General: No acute distress Cardiac: Regular Lungs: Clear Psych: Cooperative  ED Course / MDM  EKG:   I have reviewed the labs performed to date as well as medications administered while in observation.  Recent changes in the last 24 hours include patient has been accepted to [facility].  He has been hypertensive since his stay and reports a history of hypertension and being on blood pressure medication in the past but says it has been months  Plan  Current plan is for accepted to [facility] today.  Will treat blood pressure.  In the past patient has been on 10 mg of amlodipine however he reports it has been months so we will start with 5 mg.    Gwyneth Sprout, MD 07/19/23 [phone]

## 2023-08-25 DEATH — deceased
